# Patient Record
Sex: Female | Born: 1990 | Race: White | Hispanic: No | Marital: Married | Smoking: Never smoker
Health system: Southern US, Community
[De-identification: ages and names within clinical notes are randomized; demographics above are authoritative.]

## PROBLEM LIST (undated history)

## (undated) DIAGNOSIS — E079 Disorder of thyroid, unspecified: Secondary | ICD-10-CM

## (undated) HISTORY — DX: Disorder of thyroid, unspecified: E07.9

## (undated) HISTORY — PX: WISDOM TOOTH EXTRACTION: SHX21

---

## 2013-01-25 ENCOUNTER — Encounter: Payer: Self-pay | Admitting: *Deleted

## 2013-01-25 ENCOUNTER — Emergency Department (INDEPENDENT_AMBULATORY_CARE_PROVIDER_SITE_OTHER)
Admission: EM | Admit: 2013-01-25 | Discharge: 2013-01-25 | Disposition: A | Discharge status: Home / Self Care | Payer: Managed Care, Other (non HMO) | Source: Home / Self Care

## 2013-01-25 DIAGNOSIS — L02419 Cutaneous abscess of limb, unspecified: Secondary | ICD-10-CM

## 2013-01-25 DIAGNOSIS — L03116 Cellulitis of left lower limb: Secondary | ICD-10-CM

## 2013-01-25 MED ORDER — SULFAMETHOXAZOLE-TMP DS 800-160 MG PO TABS: 1.0000 | ORAL_TABLET | Freq: Two times a day (BID) | ORAL | Status: DC

## 2013-01-25 NOTE — ED Provider Notes (Signed)
CSN: [id number]     Arrival date & time 01/25/13  1420 History   None    Chief Complaint  Patient presents with  . Insect Bite   (Consider location/radiation/quality/duration/timing/severity/associated sxs/prior Treatment) HPI This patient complains of a RASH  Location: R leg  Onset: a few days   Course: worsening Self-treated with: Neosporin             Improvement with treatment: None  History Itching: no  Tenderness: yes  New medications/antibiotics: no  Pet exposure: no  Recent travel or tropical exposure: no  New soaps, shampoos, detergent, clothing: no  Tick/insect exposure: yes, maybe.  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   Red Flags Feeling ill: no  Fever: no  Facial/tongue swelling/difficulty breathing:  no  Diabetic or immunocompromised: no     History reviewed. No pertinent past medical history. Past Surgical History  Procedure Laterality Date  . Wisdom tooth extraction     History reviewed. No pertinent family history. History  Substance Use Topics  . Smoking status: Never Smoker   . Smokeless tobacco: Not on file  . Alcohol Use: No   OB History   Grav Para Term Preterm Abortions TAB SAB Ect Mult Living                 Review of Systems  All other systems reviewed and are negative.    Allergies  Other  Home Medications   Current Outpatient Rx  Name  Route  Sig  Dispense  Refill  . Norgestimate-Ethinyl Estradiol Triphasic (ORTHO TRI-CYCLEN, 28,) 0.18/0.215/0.25 MG-35 MCG tablet   Oral   Take 1 tablet by mouth daily.         Marland Kitchen sulfamethoxazole-trimethoprim (BACTRIM DS) 800-160 MG per tablet   Oral   Take 1 tablet by mouth 2 (two) times daily.   14 tablet   0    BP 112/70  Pulse 82  Temp(Src) 98.2 F (36.8 C) (Oral)  Resp 16  Ht 5\' 5"  (1.651 m)  Wt 199 lb (90.266 kg)  BMI 33.12 kg/m2  SpO2 97% Physical Exam  Nursing note and vitals reviewed. Constitutional: She is oriented to person, place, and time. She appears well-developed  and well-nourished.  HENT:  Head: Normocephalic and atraumatic.  Eyes: No scleral icterus.  Neck: Neck supple.  Cardiovascular: Regular rhythm and normal heart sounds.   Pulmonary/Chest: Effort normal and breath sounds normal. No respiratory distress.  Neurological: She is alert and oriented to person, place, and time.  Skin: Skin is warm and dry.     She has a small pustule which appears to be a bite of some kind with surrounding erythema approximately 5 cm in diameter.  No induration or fluctuance.  Minimal tenderness.  No streaking.  No other bites or lesions seen  Psychiatric: She has a normal mood and affect. Her speech is normal.    ED Course  Procedures (including critical care time) Labs Review Labs Reviewed - No data to display Imaging Review No results found.  MDM   1. Cellulitis of leg, left    This patient has a mild cellulitis of her left lower leg.  I gave her prescription for Bactrim for the next week.  No abscess is present.  I marked out the perimeter and she's going to keep an eye on it.  Wound precautions were given.  As long as she is getting better slowly no need to followup.  Keep covered and clean and dry.   Marlaine Hind,  MD 01/25/13 1457

## 2013-01-25 NOTE — ED Notes (Signed)
Patient c/o bug bite on left shin, redness, swelling, and pain. Has only tried OTC Neosporin.

## 2013-05-16 ENCOUNTER — Encounter: Payer: Self-pay | Admitting: Emergency Medicine

## 2013-05-16 ENCOUNTER — Emergency Department
Admission: EM | Admit: 2013-05-16 | Discharge: 2013-05-16 | Disposition: A | Discharge status: Home / Self Care | Payer: Managed Care, Other (non HMO) | Source: Home / Self Care

## 2013-05-16 DIAGNOSIS — Z111 Encounter for screening for respiratory tuberculosis: Secondary | ICD-10-CM

## 2013-05-16 MED ORDER — TUBERCULIN PPD 5 UNIT/0.1ML ID SOLN
5.0000 [IU] | Freq: Once | INTRADERMAL | Status: DC
  Administered 2013-05-16: 5 [IU] via INTRADERMAL

## 2013-05-16 NOTE — ED Notes (Signed)
Patient here for ppd test placement.

## 2013-05-18 ENCOUNTER — Encounter: Payer: Self-pay | Admitting: Emergency Medicine

## 2013-05-18 ENCOUNTER — Emergency Department
Admission: EM | Admit: 2013-05-18 | Discharge: 2013-05-18 | Disposition: A | Discharge status: Home / Self Care | Payer: Self-pay | Source: Home / Self Care

## 2013-05-18 NOTE — ED Notes (Signed)
PPD read of LFA placed on 05/16/13 @ 11am.  Result: NEGATIVE

## 2016-05-07 HISTORY — PX: CHOLECYSTECTOMY: SHX55

## 2020-09-21 ENCOUNTER — Other Ambulatory Visit: Payer: Self-pay

## 2020-09-21 ENCOUNTER — Encounter: Payer: Self-pay | Admitting: Obstetrics and Gynecology

## 2020-09-21 ENCOUNTER — Ambulatory Visit (INDEPENDENT_AMBULATORY_CARE_PROVIDER_SITE_OTHER): Payer: BC Managed Care – PPO | Admitting: Obstetrics and Gynecology

## 2020-09-21 VITALS — BP 118/62 | Ht 63.0 in | Wt 242.0 lb

## 2020-09-21 DIAGNOSIS — Z3169 Encounter for other general counseling and advice on procreation: Secondary | ICD-10-CM

## 2020-09-21 NOTE — Progress Notes (Signed)
Obstetrics & Gynecology Office Visit   Chief Complaint: Preconception Consult  Chief Complaint  Patient presents with  . Gynecologic Exam    Est care with Ob/Gyn. On OCP and will finish at the end of the month does not want to start any new. RM 4    History of Present Illness: Patient is a 30 y.o. No obstetric history on file. interested in pursuing pregnancy in the near future.  She reports regular menstrual periods, and is currently using OCP (estrogen/progesterone) for contraception.  Prior pregnancies history of not applicable.  Her past medical history is notable for obesity.  The patient is current on her vaccinations.    Family history for the patient and her partner's family were reviewed.  There is no family history of genetic diseases , specifically Alfonzo Feller disease, spinal muscular atrophy, muscular dystrophy, skeletal dysplasias, cystic fibrosis and sickle cell disease.  There is no family history of birth defects , specifically spina bifida, cardiac defects, omphalocele or gastroschisis and cleft lip or cleft palate.  There is no [place] Ashkenazi jewish, middle [place] and [place] ancestry.  There is no family history of mental retardation, fragile X, autism and or premature ovarian failure.    Pregnancies with current partner no Significant other with no medical problems or on any current medications No environmental or work place exposures.    Normal thyroid panel 2020, normal cholesterol 06/07/2020.  Normal uterus and ovaries on CT A/P 01/27/2017  Review of Systems: 10 point review of systems negative unless otherwise noted in HPI  Past Medical History:  There are no problems to display for this patient.   Past Surgical History:  Past Surgical History:  Procedure Laterality Date  . WISDOM TOOTH EXTRACTION      Obstetric History:  Never pregnant  Family History:  No family history on file.   Social History:  Social History   Socioeconomic  History  . Marital status: Single    Spouse name: Not on file  . Number of children: Not on file  . Years of education: Not on file  . Highest education level: Not on file  Occupational History  . Not on file  Tobacco Use  . Smoking status: Never Smoker  . Smokeless tobacco: Never Used  Vaping Use  . Vaping Use: Never used  Substance and Sexual Activity  . Alcohol use: No  . Drug use: No  . Sexual activity: Yes    Partners: Male    Birth control/protection: Pill  Other Topics Concern  . Not on file  Social History Narrative  . Not on file   Social Determinants of Health   Financial Resource Strain: Not on file  Food Insecurity: Not on file  Transportation Needs: Not on file  Physical Activity: Not on file  Stress: Not on file  Social Connections: Not on file  Intimate Partner Violence: Not on file    Allergies:  Allergies  Allergen Reactions  . Other     Allergic to cats    Medications: Prior to Admission medications   Medication Sig Start Date End Date Taking? Authorizing Provider  cetirizine (ZYRTEC) 10 MG tablet Take by mouth.    [provider]  Norgestimate-Ethinyl Estradiol Triphasic (ORTHO TRI-CYCLEN, 28,) 0.18/0.215/0.25 MG-35 MCG tablet Take 1 tablet by mouth daily.    [provider]    Physical Exam Vitals: Blood pressure 118/62, height 5\' 3"  (1.6 m), weight 242 lb (109.8 kg), last menstrual period 08/17/2020.  General:  NAD, well nourished appears stated age  HEENT: normocephalic, anicteric Pulmonary: No increased work of breathing Extremities: no edema, erythema, or tenderness Neurologic: Grossly intact Psychiatric: mood appropriate, affect full  Immunization History  Administered Date(s) Administered  . PPD Test 05/16/2013    Assessment: 30 y.o. No obstetric history on file. presenting for initial infertility evaluatoin  Problem List Items Addressed This Visit   None   Visit Diagnoses    Encounter for preconception  consultation    -  Primary     Plan: 1) We discussed that with discontinuation of OCP generally normal return to menstrual status is achieved within the first few months.  If menses are regular monthly chances of conception are 20% per month.  If menstrual cycles are irregular in interval this may require additional work up and interventions.  We discussed use of OPK for timing of intercourse.  She ask about PCOS in particular, no hirsutism or ACNE, weight stable.  If menstrual irregularities off OCPs this would be something evaluated in the work up.  2) The patient was instructed to start prenatal vitamins at least one month prior to actively trying to conceive.  The role and rational of prenatal vitamins in preventing neural tube defects were discussed.  3) Immunizations are up to date -ACOG recommendation of COVID vaccination for pregnant patient  4) Family history reviewed.  Preconception genetic testing and or counseling was offered at today's visit based on review of personal and family history. - discussed inheritest   5) Health care maintenance - pap up to date 05/09/2018 NILM  6) A total of 20 minutes were spent in face-to-face contact with the patient during this encounter with over half of that time devoted to counseling and coordination of care.  7) Return in about 1 year (around 09/21/2021), or if symptoms worsen or fail to improve, for annual.   Vena Austria, MD, [facility], [facility] 09/21/2020, 4:18 PM

## 2021-04-06 ENCOUNTER — Encounter: Payer: Self-pay | Admitting: Obstetrics and Gynecology

## 2021-04-26 ENCOUNTER — Other Ambulatory Visit: Payer: Self-pay

## 2021-04-26 ENCOUNTER — Ambulatory Visit: Payer: BC Managed Care – PPO | Admitting: Obstetrics and Gynecology

## 2021-04-26 ENCOUNTER — Encounter: Payer: Self-pay | Admitting: Obstetrics and Gynecology

## 2021-04-26 ENCOUNTER — Other Ambulatory Visit (HOSPITAL_COMMUNITY)
Admission: RE | Admit: 2021-04-26 | Discharge: 2021-04-26 | Disposition: A | Discharge status: Home / Self Care | Payer: Managed Care, Other (non HMO) | Source: Ambulatory Visit | Attending: Obstetrics and Gynecology | Admitting: Obstetrics and Gynecology

## 2021-04-26 VITALS — BP 114/73 | Ht 65.0 in | Wt 240.0 lb

## 2021-04-26 DIAGNOSIS — N939 Abnormal uterine and vaginal bleeding, unspecified: Secondary | ICD-10-CM | POA: Diagnosis not present

## 2021-04-26 DIAGNOSIS — E669 Obesity, unspecified: Secondary | ICD-10-CM

## 2021-04-26 DIAGNOSIS — Z1329 Encounter for screening for other suspected endocrine disorder: Secondary | ICD-10-CM | POA: Diagnosis not present

## 2021-04-26 DIAGNOSIS — Z6839 Body mass index (BMI) 39.0-39.9, adult: Secondary | ICD-10-CM

## 2021-04-26 DIAGNOSIS — Z124 Encounter for screening for malignant neoplasm of cervix: Secondary | ICD-10-CM

## 2021-04-26 DIAGNOSIS — Z131 Encounter for screening for diabetes mellitus: Secondary | ICD-10-CM

## 2021-04-26 NOTE — Progress Notes (Signed)
Gynecology Abnormal Uterine Bleeding Initial Evaluation   Chief Complaint:  Chief Complaint  Patient presents with   Menstrual Problem    Discuss fertility - Procedure RM     History of Present Illness:    Paitient is a 30 y.o. female with last menstrual period was 04/09/2021., presents today for a problem visit.  She complains of irregular menstrual cycles  since discontinuing OCP in May of this year. Intervals have lengthened, not consistently proceeded by moliminal symptoms.    PCOS: weight stable, some acne, fascial hirsutism Thyroid: no temperature intolerance, diarrhea/constipation, palpitation, hair loss or skin changes Hyperprolactinemia: no headaches, vision changes, or nipple discharge Fragile-X: no family history of premature ovarian failure, autism  Tubal Factor: no history of pelvic surgeries, PID, GC/CT   Review of Systems: Review of Systems  Constitutional: Negative.   Eyes:  Negative for blurred vision and double vision.  Gastrointestinal: Negative.   Genitourinary: Negative.   Neurological:  Negative for headaches.   Past Medical History:  There are no problems to display for this patient.   Past Surgical History:  Past Surgical History:  Procedure Laterality Date   WISDOM TOOTH EXTRACTION      Obstetric History: G0P0000  Family History:  Family History  Problem Relation Age of Onset   Diabetes Paternal Grandmother     Social History:  Social History   Socioeconomic History   Marital status: Single    Spouse name: Not on file   Number of children: Not on file   Years of education: Not on file   Highest education level: Not on file  Occupational History   Not on file  Tobacco Use   Smoking status: Never   Smokeless tobacco: Never  Vaping Use   Vaping Use: Never used  Substance and Sexual Activity   Alcohol use: No   Drug use: No   Sexual activity: Yes    Partners: Male    Birth control/protection: None  Other Topics Concern    Not on file  Social History Narrative   Not on file   Social Determinants of Health   Financial Resource Strain: Not on file  Food Insecurity: Not on file  Transportation Needs: Not on file  Physical Activity: Not on file  Stress: Not on file  Social Connections: Not on file  Intimate Partner Violence: Not on file    Allergies:  Allergies  Allergen Reactions   Other     Allergic to cats    Medications: Prior to Admission medications   Medication Sig Start Date End Date Taking? Authorizing Provider  cetirizine (ZYRTEC) 10 MG tablet Take by mouth.   Yes [provider]  loratadine (CLARITIN) 10 MG tablet Take by mouth.   Yes [provider]  Norgestimate-Ethinyl Estradiol Triphasic 0.18/0.215/0.25 MG-35 MCG tablet Take 1 tablet by mouth daily. Patient not taking: Reported on 04/26/2021    [provider]    Physical Exam Blood pressure 114/73, height 5\' 5"  (1.651 m), weight 240 lb (108.9 kg), last menstrual period 04/09/2021. Body mass index is 39.94 kg/m.  Patient's last menstrual period was 04/09/2021.  General: NAD HEENT: normocephalic, anicteric Pulmonary: No increased work of breathing Genitourinary:  External: Normal external female genitalia.  Normal urethral meatus, normal Bartholin's and Skene's glands.    Vagina: Normal vaginal mucosa, no evidence of prolapse.    Cervix: Grossly normal in appearance, no bleeding  Uterus: Non-enlarged, mobile, normal contour.  No CMT  Adnexa: ovaries non-enlarged, no  adnexal masses  Rectal: deferred  Lymphatic: no evidence of inguinal lymphadenopathy Extremities: no edema, erythema, or tenderness Neurologic: Grossly intact Psychiatric: mood appropriate, affect full  Female chaperone present for pelvic portions of the physical exam  Assessment: 30 y.o. G0P0000 with abnormal uterine bleeding  Plan: Problem List Items Addressed This Visit   None Visit Diagnoses     Screening for malignant  neoplasm of cervix    -  Primary   Relevant Orders   Cytology - PAP (Completed)   Abnormal uterine bleeding       Relevant Orders   17-Hydroxyprogesterone (Completed)   Androstenedione (Completed)   DHEA-sulfate (Completed)   FSH/LH (Completed)   Hemoglobin A1c (Completed)   Testosterone,Free and Total (Completed)   Prolactin (Completed)   TSH (Completed)   US PELVIC COMPLETE WITH TRANSVAGINAL   Thyroid disorder screening       Relevant Orders   TSH (Completed)   Screening for diabetes mellitus       Relevant Orders   Hemoglobin A1c (Completed)   Class 2 obesity with body mass index (BMI) of 39.0 to 39.9 in adult, unspecified obesity type, unspecified whether serious comorbidity present       Relevant Orders   Hemoglobin A1c (Completed)       1) Discussed management options for abnormal uterine bleeding including expectant, NSAIDs, tranexamic acid (Lysteda), oral progesterone (Provera, norethindrone, megace), Depo Provera, Levonorgestrel containing IUD, endometrial ablation (Novasure) or hysterectomy as definitive surgical management.  As the patient is interested in conceiving none of these options are indicated.   Final management decision will hinge on results of patient's work up and whether an underlying etiology for the patients bleeding symptoms can be discerned.  We will conduct a basic work up examining using the PALM-COIEN classification system.  High suspicion for PCOS.  We discussed the underlying etiologies which may be implicated in a couple experiencing difficulty conceiving.  The average couple will conceive within the span of 1 year with unprotected coitus, with a monthly fecundity rate of 20% or 1 in 5.  Even without further work up or intervention the patient and her partner may be successful in conceiving unassisted, although if an underlying etiology can be identified and addressed fecundity rate may improve.  The work up entails examining for ovulatory function,  tubal patency, and ruling out female factor infertility.  These may be looked at concurrently or sequentially.  The downside of sequential work up is that this method may miss issues if more than one compartment is contributing.  She is aware that tubal factor or moderate to severe female factor infertility will require further consultation with a reproductive endocrinologist.  In the case of anovulation, use of Clomid (clomiphen citrate) or Femara (letrazole) were discussed with the understanding the the later is an off-label, but well supported use.  Current recommendation are to use letrozole first line secondary to increased live birth rate compared to clomid for patients with PCOS ("Polycystic Ovarian Syndrome" ACOG Practice Bulletin 194 June 2018).  With either of these drugs the risk of multiples increases from the standard population rate of 2% to approximately 10%, with higher order multiples possible but unlikely.  Both drugs may require some time to titrate to the appropriate dosage to ensure consistent ovulation.  Cycles will be limited to 6 cycles on each drug secondary to decreasing rates of conception after 6 cycles.  In addition should patient be started on ovulation induction with Clomid she was advised to discontinue the  drug for any vision changes as this is a rare but potentially permanent side-effect if medication is continued.  Was counseled that letrozole may cause idiopathic bone pain that generally resolves.  Hot flashes, headaches, and nausea were mentioned as the most commonly encountered side-effects of both drugs.  We discussed timing of intercourse as well as the use of ovulation predictor kits identify the patient's fertile window each month.    - last pap 05/09/2018 NILM repeat today - routine labs - ultrasound ordered - interested in conceiving off OCP since May with irregular cycles since, occasional moliminal symptoms   2) A total of 32 minutes were spent in face-to-face contact  with the patient during this encounter with over half of that time devoted to counseling and coordination of care.   3) Return after [facility] ultrasound results to discuss next steps.   Vena Austria, MD, [facility], [facility] 04/26/2021, 9:19 AM

## 2021-04-28 ENCOUNTER — Encounter: Payer: Self-pay | Admitting: Obstetrics and Gynecology

## 2021-05-01 LAB — 17-HYDROXYPROGESTERONE: 17-Hydroxyprogesterone: 63 ng/dL

## 2021-05-01 LAB — HEMOGLOBIN A1C
Est. average glucose Bld gHb Est-mCnc: 105 mg/dL
Hgb A1c MFr Bld: 5.3 % (ref 4.8–5.6)

## 2021-05-01 LAB — ANDROSTENEDIONE: Androstenedione: 103 ng/dL (ref 41–262)

## 2021-05-01 LAB — DHEA-SULFATE: DHEA-SO4: 374 ug/dL (ref 84.8–378.0)

## 2021-05-01 LAB — FSH/LH
FSH: 3.9 m[IU]/mL
LH: 12.2 m[IU]/mL

## 2021-05-01 LAB — TSH: TSH: 4.8 u[IU]/mL — ABNORMAL HIGH (ref 0.450–4.500)

## 2021-05-01 LAB — TESTOSTERONE,FREE AND TOTAL
Testosterone, Free: 2.8 pg/mL (ref 0.0–4.2)
Testosterone: 42 ng/dL (ref 13–71)

## 2021-05-01 LAB — PROLACTIN: Prolactin: 14.6 ng/mL (ref 4.8–23.3)

## 2021-05-03 LAB — CYTOLOGY - PAP
Adequacy: ABSENT
Comment: NEGATIVE
Diagnosis: NEGATIVE
High risk HPV: NEGATIVE

## 2021-05-08 ENCOUNTER — Encounter: Payer: Self-pay | Admitting: Obstetrics and Gynecology

## 2021-05-10 ENCOUNTER — Ambulatory Visit
Admission: RE | Admit: 2021-05-10 | Discharge: 2021-05-10 | Disposition: A | Discharge status: Home / Self Care | Payer: BC Managed Care – PPO | Source: Ambulatory Visit | Attending: Obstetrics and Gynecology | Admitting: Obstetrics and Gynecology

## 2021-05-10 ENCOUNTER — Other Ambulatory Visit: Payer: Self-pay

## 2021-05-10 DIAGNOSIS — N939 Abnormal uterine and vaginal bleeding, unspecified: Secondary | ICD-10-CM | POA: Diagnosis present

## 2021-05-11 ENCOUNTER — Encounter: Payer: Self-pay | Admitting: Obstetrics and Gynecology

## 2021-05-11 ENCOUNTER — Other Ambulatory Visit: Payer: Self-pay | Admitting: Obstetrics and Gynecology

## 2021-05-11 DIAGNOSIS — N939 Abnormal uterine and vaginal bleeding, unspecified: Secondary | ICD-10-CM

## 2021-05-11 DIAGNOSIS — R7989 Other specified abnormal findings of blood chemistry: Secondary | ICD-10-CM

## 2021-05-11 NOTE — Telephone Encounter (Signed)
Contacted patient via phone. Patient is scheduled for 05/17/21

## 2021-05-17 ENCOUNTER — Other Ambulatory Visit: Payer: BC Managed Care – PPO

## 2021-05-17 ENCOUNTER — Other Ambulatory Visit: Payer: Self-pay

## 2021-05-17 DIAGNOSIS — N939 Abnormal uterine and vaginal bleeding, unspecified: Secondary | ICD-10-CM

## 2021-05-17 DIAGNOSIS — R7989 Other specified abnormal findings of blood chemistry: Secondary | ICD-10-CM

## 2021-05-18 LAB — THYROID PANEL WITH TSH
Free Thyroxine Index: 1.7 (ref 1.2–4.9)
T3 Uptake Ratio: 23 % — ABNORMAL LOW (ref 24–39)
T4, Total: 7.6 ug/dL (ref 4.5–12.0)
TSH: 5.44 u[IU]/mL — ABNORMAL HIGH (ref 0.450–4.500)

## 2021-05-23 ENCOUNTER — Other Ambulatory Visit: Payer: Self-pay | Admitting: Obstetrics and Gynecology

## 2021-05-23 ENCOUNTER — Encounter: Payer: Self-pay | Admitting: Obstetrics and Gynecology

## 2021-05-23 DIAGNOSIS — E038 Other specified hypothyroidism: Secondary | ICD-10-CM

## 2021-05-23 DIAGNOSIS — E039 Hypothyroidism, unspecified: Secondary | ICD-10-CM | POA: Insufficient documentation

## 2021-05-23 MED ORDER — LEVOTHYROXINE SODIUM 25 MCG PO TABS: 50.0000 ug | ORAL_TABLET | Freq: Every day | ORAL | 3 refills | Status: DC

## 2021-05-23 NOTE — Progress Notes (Signed)
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  Will start low dose synthroid 4mcg, need recheck of levels in about 6-12 weeks

## 2021-05-23 NOTE — Telephone Encounter (Signed)
Please schedule in 6-8 weeks with sch or rph

## 2021-06-05 ENCOUNTER — Encounter: Payer: Self-pay | Admitting: Obstetrics & Gynecology

## 2021-07-07 ENCOUNTER — Telehealth: Payer: Self-pay

## 2021-07-07 NOTE — Telephone Encounter (Signed)
This encounter was created in error - please disregard.

## 2021-07-07 NOTE — Telephone Encounter (Signed)
Pt called after hour nurse 07/06/21 4;:49pm stating she when to p/u her thyroid med and the pharm didn't have refill for it; they told her they sent a request for a refill and maybe the rx is expired.  [phone]  I called pt this morning to ask what pharm and if she had enough to get her thru to her appt Wed.  She prefers [facility] and she took her last pill this morning. Adv I will send to On Call. ?

## 2021-07-10 ENCOUNTER — Other Ambulatory Visit: Payer: Self-pay | Admitting: Advanced Practice Midwife

## 2021-07-10 DIAGNOSIS — E038 Other specified hypothyroidism: Secondary | ICD-10-CM

## 2021-07-10 MED ORDER — LEVOTHYROXINE SODIUM 25 MCG PO TABS: 50.0000 ug | ORAL_TABLET | Freq: Every day | ORAL | 0 refills | Status: DC

## 2021-07-10 NOTE — Telephone Encounter (Signed)
Pt aware refill sent to [facility]. ?

## 2021-07-10 NOTE — Progress Notes (Signed)
Rx for synthroid sent to [facility] per pt request- enough to last until next visit.  ?

## 2021-07-12 ENCOUNTER — Other Ambulatory Visit: Payer: Self-pay

## 2021-07-12 ENCOUNTER — Encounter: Payer: Self-pay | Admitting: Obstetrics & Gynecology

## 2021-07-12 ENCOUNTER — Ambulatory Visit: Payer: BC Managed Care – PPO | Admitting: Obstetrics & Gynecology

## 2021-07-12 VITALS — BP 120/80 | Ht 65.0 in | Wt 241.0 lb

## 2021-07-12 DIAGNOSIS — E038 Other specified hypothyroidism: Secondary | ICD-10-CM | POA: Diagnosis not present

## 2021-07-12 MED ORDER — LEVOTHYROXINE SODIUM 50 MCG PO TABS: 50.0000 ug | ORAL_TABLET | Freq: Every day | ORAL | 3 refills | Status: DC

## 2021-07-12 NOTE — Progress Notes (Signed)
?  History of Present Illness:  Kristie Santiago is a 31 y.o. who recently underwent labwork for infertility.  She presents for discussion of results and plan of management. Results revealed low tgyroid, and she was thus started on medicine (Jan).  Since that time she does report more regularity w her periods.  No neg side effects.  She dis miss pills from Fr to [facility] due to Rx not having refills at pharmacy.  Planning for pregnancy, labs and [place] reviewed w pt again.   ? ?PMHx: ?She  has no past medical history on file. Also,  has a past surgical history that includes Wisdom tooth extraction., family history includes Diabetes in her paternal grandmother.,  reports that she has never smoked. She has never used smokeless tobacco. She reports that she does not drink alcohol and does not use drugs. ?Current Meds  ?Medication Sig  ? cetirizine (ZYRTEC) 10 MG tablet Take by mouth.  ? levothyroxine (SYNTHROID) 25 MCG tablet Take 2 tablets (50 mcg total) by mouth daily before breakfast.  ? loratadine (CLARITIN) 10 MG tablet Take by mouth.  ?. Also, is allergic to other.. ? ?Review of Systems  ?All other systems reviewed and are negative. ? ?Physical Exam:  ?BP 120/80   Ht 5\' 5"  (1.651 m)   Wt 241 lb (109.3 kg)   LMP 07/10/2021   BMI 40.10 kg/m?  Body mass index is 40.1 kg/m?09/09/2021 ?Constitutional: Well nourished, well developed female in no acute distress.  ?Abdomen: diffusely non tender to palpation, non distended, and no masses, hernias ?Neuro: Grossly intact ?Psych:  Normal mood and affect.   ? ?Assessment:  ?Problem List Items Addressed This Visit   ? ?  ? Endocrine  ? Subclinical hypothyroidism - Primary  ? Relevant Orders  ? Thyroid Panel With TSH  ? ?Recheck levels once back on again regularly. ?If normal, then give a few months to see as to fertility ?Options of ovulation inducing meds discussed for later intervention ?Synthroid 50 mcg daily current dose.  Up as needed.  Benefit of normal to high-normal level for  fertility and fetal effects discussed. ? ?A total of 22 minutes were spent face-to-face with the patient as well as preparation, review, communication, and documentation during this encounter.  ? ? ?Marland Kitchen, MD, FACOG ?[facility], [facility] ?07/12/2021  2:46 PM ? ?

## 2021-07-19 ENCOUNTER — Other Ambulatory Visit: Payer: Self-pay

## 2021-07-19 ENCOUNTER — Other Ambulatory Visit: Payer: BC Managed Care – PPO

## 2021-07-19 DIAGNOSIS — E038 Other specified hypothyroidism: Secondary | ICD-10-CM

## 2021-07-20 LAB — THYROID PANEL WITH TSH
Free Thyroxine Index: 3 (ref 1.2–4.9)
T3 Uptake Ratio: 28 % (ref 24–39)
T4, Total: 10.8 ug/dL (ref 4.5–12.0)
TSH: 0.385 u[IU]/mL — ABNORMAL LOW (ref 0.450–4.500)

## 2021-10-16 ENCOUNTER — Telehealth: Payer: Self-pay

## 2021-10-16 NOTE — Telephone Encounter (Signed)
Pt called triage wanting someone to order HCG labs for her, she had a positive pregnancy test, She is going to [facility] for her pregnancy so I advised her to have them put the lab orders in.

## 2022-01-06 IMAGING — US US PELVIS COMPLETE WITH TRANSVAGINAL
1 series · 13 of 25 positions shown · non-contrast
Comparison: None available.

CLINICAL DATA: Initial evaluation for abnormal uterine bleeding,
question PCOS.

EXAM:
TRANSABDOMINAL AND TRANSVAGINAL ULTRASOUND OF PELVIS
TECHNIQUE: Both transabdominal and transvaginal ultrasound examinations of the
pelvis were performed. Transabdominal technique was performed for
global imaging of the pelvis including uterus, ovaries, adnexal
regions, and pelvic cul-de-sac. It was necessary to proceed with
endovaginal exam following the transabdominal exam to visualize the
endometrium and ovaries.

[Series 1: us pelvic complete with transvaginal · 13 of 59 slices shown]
[im 1/59]
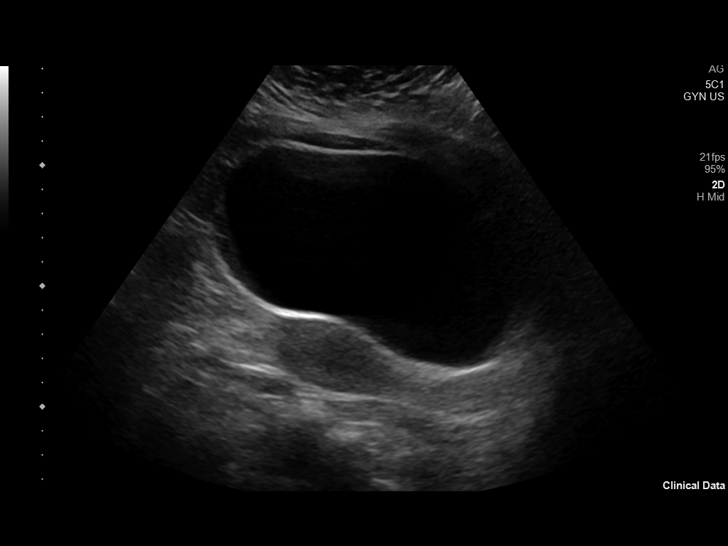
[im 5/59]
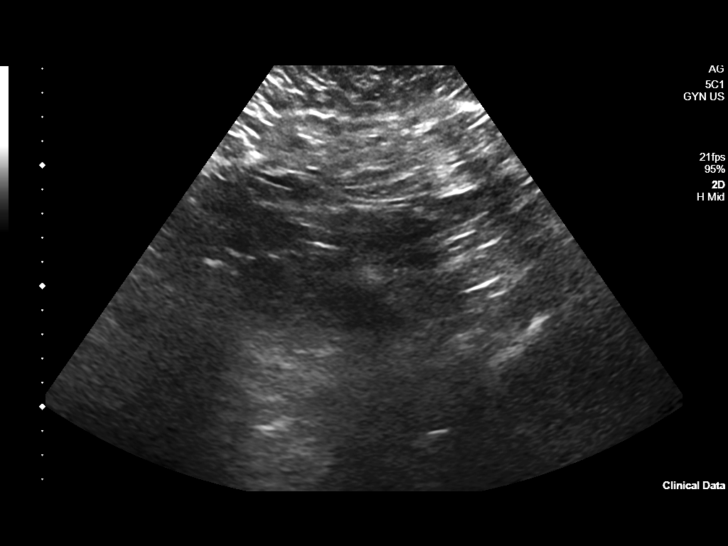
[im 10/59]
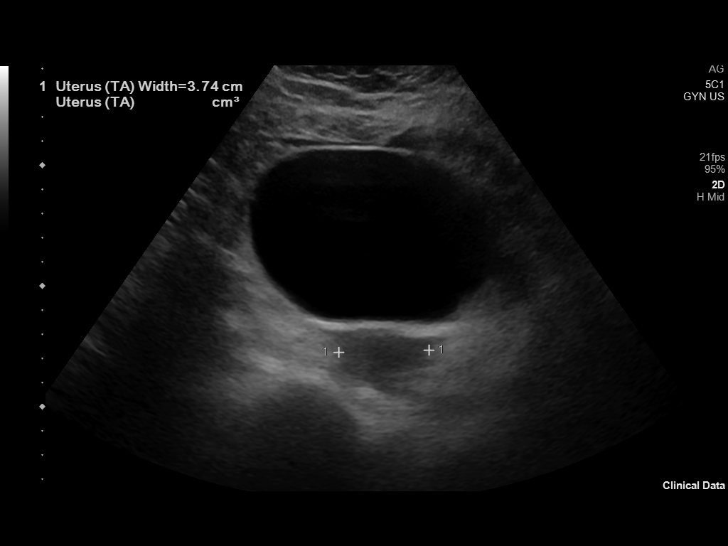
[im 15/59]
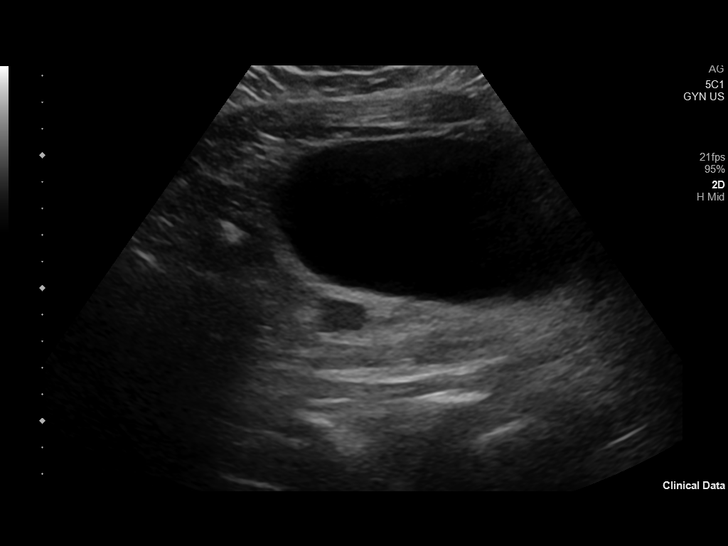
[im 20/59]
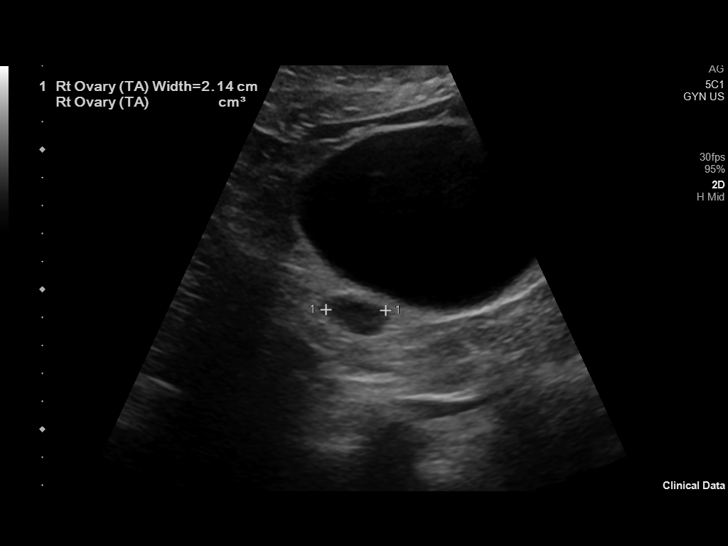
[im 25/59]
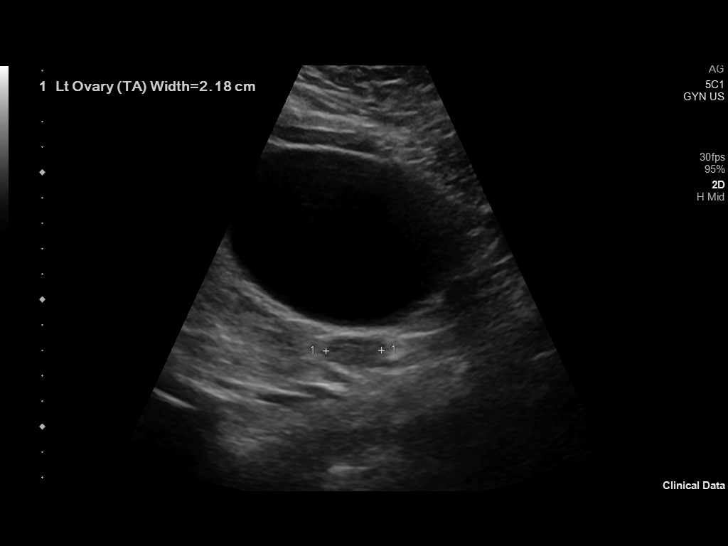
[im 30/59]
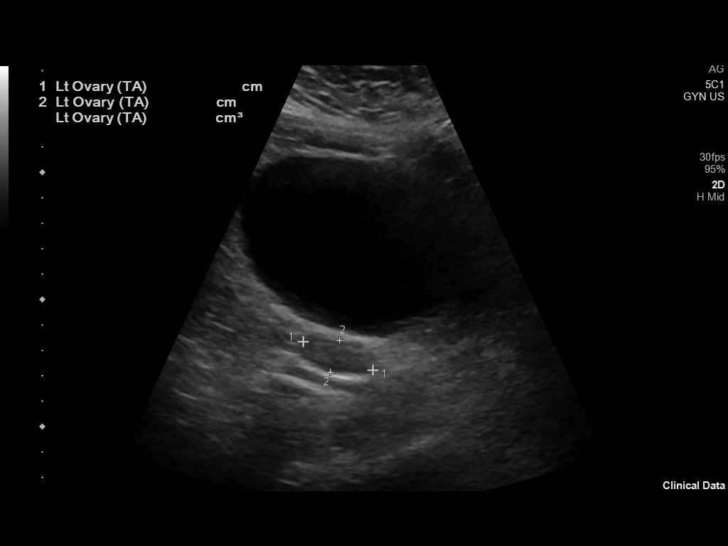
[im 34/59]
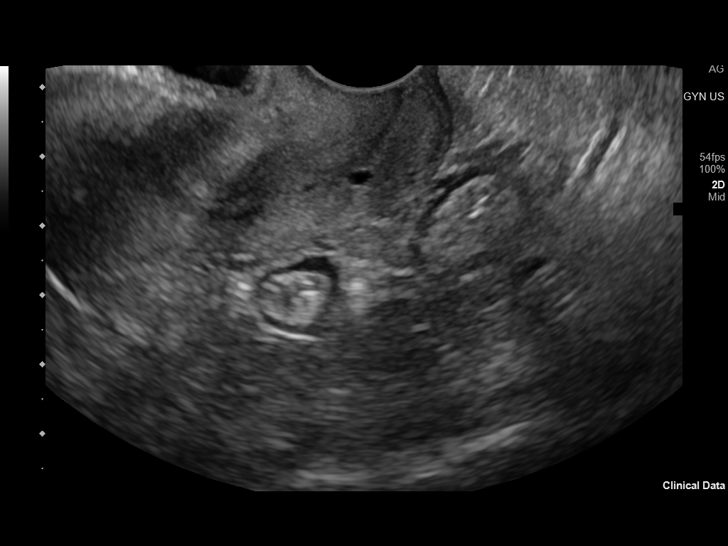
[im 39/59]
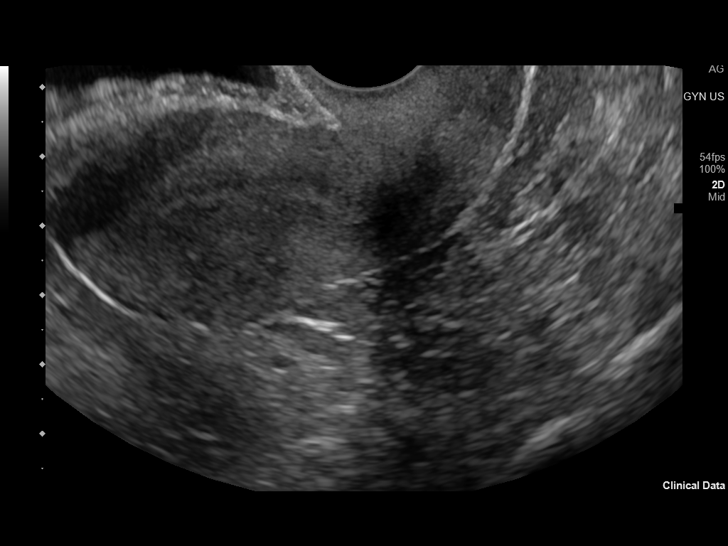
[im 44/59]
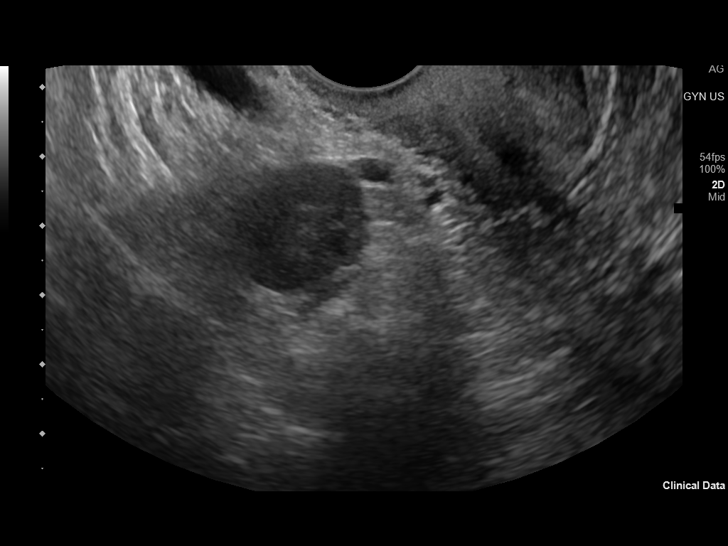
[im 49/59]
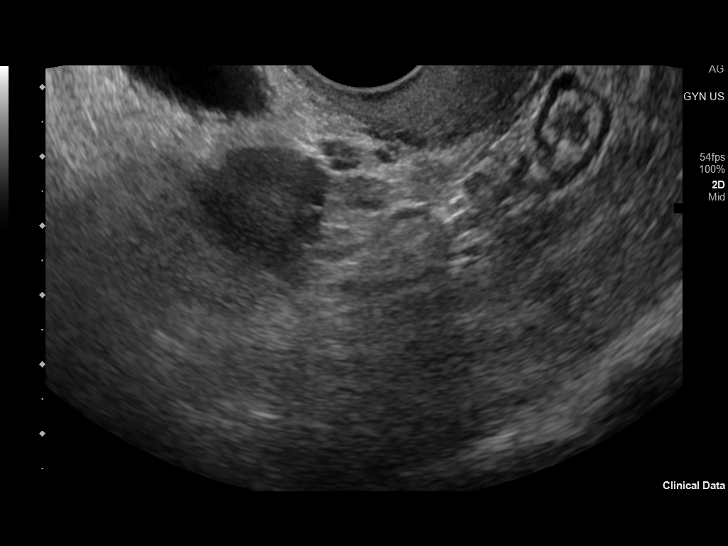
[im 54/59]
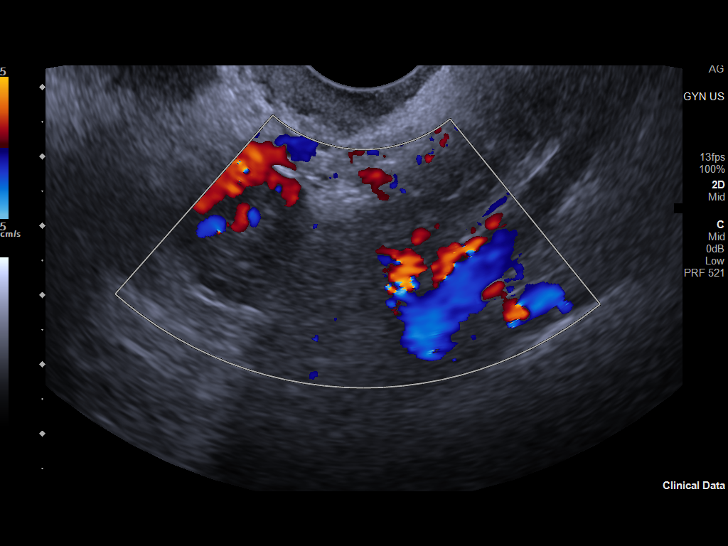
[im 59/59]
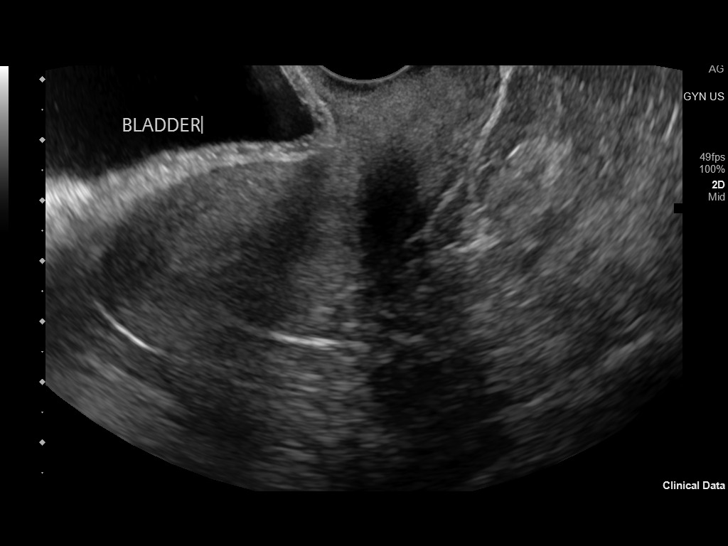

[13 of 25 positions shown; findings below may reference images not displayed]

FINDINGS: Uterus

Measurements: 9.0 x 2.9 x 3.7 cm = volume: 51.0 mL. Uterus is
anteverted. No discrete fibroid or other mass.

Endometrium

Thickness: 3 mm.  No focal abnormality visualized.

Right ovary

Measurements: 1.9 x 1.7 x 1.9 cm = volume: 3.0 mL. Normal
appearance/no adnexal mass. No sonographic features of PCOS.

Left ovary

Measurements: 2.0 x 1.5 x 1.4 cm = volume: 2.2 mL. Normal
appearance/no adnexal mass. No sonographic features of PCOS.

Other findings

No abnormal free fluid.
IMPRESSION: 1. Normal pelvic ultrasound. No sonographic features to suggest
PCOS.
2. Endometrial stripe within normal limits measuring 3 mm in
thickness. If bleeding remains unresponsive to hormonal or medical
therapy, sonohysterogram should be considered for focal lesion
work-up. (Ref: Radiological Reasoning: Algorithmic Workup of
Abnormal Vaginal Bleeding with Endovaginal Sonography and
Sonohysterography. AJR 7996; 191:S68-73).

## 2022-06-20 LAB — HM HIV SCREENING LAB: HM HIV Screening: NEGATIVE

## 2022-06-20 LAB — HM HEPATITIS C SCREENING LAB: HM Hepatitis Screen: NEGATIVE

## 2022-09-10 NOTE — Progress Notes (Unsigned)
    Aleen Sells D.[facility] [address] [place] [address] Phone: [phone]   Assessment and Plan:     There are no diagnoses linked to this encounter.  ***   Pertinent previous records reviewed include ***   Follow Up: ***     Subjective:   I, Judge Duque, am serving as a Neurosurgeon for Doctor Richardean Sale  Chief Complaint: bilat carpal tunnel   HPI:   09/11/2022 Patient is a 32 year old female complaining of bilat carpal tunnel pain. Patient states  Relevant Historical Information: ***  Additional pertinent review of systems negative.   Current Outpatient Medications:    cetirizine (ZYRTEC) 10 MG tablet, Take by mouth., Disp: , Rfl:    levothyroxine (SYNTHROID) 50 MCG tablet, Take 1 tablet (50 mcg total) by mouth daily before breakfast., Disp: 90 tablet, Rfl: 3   loratadine (CLARITIN) 10 MG tablet, Take by mouth., Disp: , Rfl:    Objective:     There were no vitals filed for this visit.    There is no height or weight on file to calculate BMI.    Physical Exam:    ***   Electronically signed by:  Aleen Sells D.[facility] 12:16 PM 09/10/22

## 2022-09-11 ENCOUNTER — Ambulatory Visit (INDEPENDENT_AMBULATORY_CARE_PROVIDER_SITE_OTHER): Payer: BC Managed Care – PPO | Admitting: Sports Medicine

## 2022-09-11 VITALS — BP 108/80 | HR 77 | Ht 65.0 in | Wt 231.0 lb

## 2022-09-11 DIAGNOSIS — M65331 Trigger finger, right middle finger: Secondary | ICD-10-CM

## 2022-09-11 DIAGNOSIS — M65332 Trigger finger, left middle finger: Secondary | ICD-10-CM

## 2022-09-11 NOTE — Patient Instructions (Signed)
Good to see you   

## 2022-10-02 NOTE — Progress Notes (Signed)
    Kristie Santiago D.[facility] [address] [place] [address] Phone: [phone]   Assessment and Plan:     1. Trigger middle finger of left hand 2. Trigger middle finger of right hand  -Chronic with exacerbation, subsequent visit - Resolution of triggering of bilateral third digit after trigger finger CSI performed at previous office visit on 09/11/2022.  Trigger fingers were likely flared during pregnancy - May continue to use topical Voltaren gel as needed  Pertinent previous records reviewed include none   Follow Up: As needed.  Hope to have at least 3 months relief from injections.  Could repeat injections if longer than 3 months relief is achieved.  Could refer to hand surgery if less than 3 months relief   Subjective:   I, Kristie Santiago, am serving as a Neurosurgeon for Doctor Richardean Sale   Chief Complaint: bilat carpal tunnel    HPI:    09/11/2022 Patient is a 32 year old female complaining of bilat carpal tunnel pain. Patient states that she has pain in her middle fingers in the morning they get stuck and she can't bend or carry her new born, been going on for about 6 months, no meds consistently for the pain, no numbness or tingling, no radiating pain, TTP when she presses on them , the more she goes through the day the easier the more she is able to do   10/09/2022 Patient states no issues since CSI    Relevant Historical Information: Recent C-section delivery,  Additional pertinent review of systems negative.   Current Outpatient Medications:    cetirizine (ZYRTEC) 10 MG tablet, Take by mouth., Disp: , Rfl:    levothyroxine (SYNTHROID) 50 MCG tablet, Take 1 tablet (50 mcg total) by mouth daily before breakfast., Disp: 90 tablet, Rfl: 3   loratadine (CLARITIN) 10 MG tablet, Take by mouth., Disp: , Rfl:    Objective:     Vitals:   10/09/22 1535  Weight: 238 lb (108 kg)  Height: 5\' 5"  (1.651 m)      Body mass index is  39.61 kg/m.    Physical Exam:    General: Appears well, nad, nontoxic and pleasant Neuro:sensation intact, strength is 5/5 with df/pf/inv/ev, muscle tone wnl Skin:no susupicious lesions or rashes   Bilateral hand/wrist: No deformity or swelling appreciated. ROM  Ext 90, flexion70, radial/ulnar deviation 30 No palpable nodule at base of third MCP bilaterally and no catching of 3rd digit nttp over the snuff box, dorsal carpals, volar carpals, radial styloid, ulnar styloid, 1st mcp, tfcc Negative Tinel's     Electronically signed by:  Kristie Santiago D.[facility] 3:38 PM 10/09/22

## 2022-10-09 ENCOUNTER — Ambulatory Visit (INDEPENDENT_AMBULATORY_CARE_PROVIDER_SITE_OTHER): Payer: BC Managed Care – PPO | Admitting: Sports Medicine

## 2022-10-09 VITALS — BP 122/78 | HR 103 | Ht 65.0 in | Wt 238.0 lb

## 2022-10-09 DIAGNOSIS — M65331 Trigger finger, right middle finger: Secondary | ICD-10-CM | POA: Diagnosis not present

## 2022-10-09 DIAGNOSIS — M65332 Trigger finger, left middle finger: Secondary | ICD-10-CM

## 2022-12-10 DIAGNOSIS — F4323 Adjustment disorder with mixed anxiety and depressed mood: Secondary | ICD-10-CM | POA: Diagnosis not present

## 2022-12-10 DIAGNOSIS — Z634 Disappearance and death of family member: Secondary | ICD-10-CM | POA: Diagnosis not present

## 2022-12-26 ENCOUNTER — Emergency Department: Payer: BC Managed Care – PPO

## 2022-12-26 ENCOUNTER — Emergency Department
Admission: EM | Admit: 2022-12-26 | Discharge: 2022-12-26 | Disposition: A | Discharge status: Home / Self Care | Payer: BC Managed Care – PPO | Attending: Emergency Medicine | Admitting: Emergency Medicine

## 2022-12-26 ENCOUNTER — Other Ambulatory Visit: Payer: Self-pay

## 2022-12-26 DIAGNOSIS — W1830XA Fall on same level, unspecified, initial encounter: Secondary | ICD-10-CM | POA: Insufficient documentation

## 2022-12-26 DIAGNOSIS — R202 Paresthesia of skin: Secondary | ICD-10-CM | POA: Diagnosis not present

## 2022-12-26 DIAGNOSIS — M25572 Pain in left ankle and joints of left foot: Secondary | ICD-10-CM | POA: Insufficient documentation

## 2022-12-26 DIAGNOSIS — Y990 Civilian activity done for income or pay: Secondary | ICD-10-CM | POA: Diagnosis not present

## 2022-12-26 DIAGNOSIS — W19XXXA Unspecified fall, initial encounter: Secondary | ICD-10-CM | POA: Diagnosis not present

## 2022-12-26 DIAGNOSIS — M79672 Pain in left foot: Secondary | ICD-10-CM | POA: Diagnosis not present

## 2022-12-26 DIAGNOSIS — S99912A Unspecified injury of left ankle, initial encounter: Secondary | ICD-10-CM | POA: Diagnosis not present

## 2022-12-26 DIAGNOSIS — M25579 Pain in unspecified ankle and joints of unspecified foot: Secondary | ICD-10-CM | POA: Diagnosis not present

## 2022-12-26 MED ORDER — HYDROCODONE-ACETAMINOPHEN 5-325 MG PO TABS: 1.0000 | ORAL_TABLET | Freq: Four times a day (QID) | ORAL | 0 refills | Status: DC | PRN

## 2022-12-26 MED ORDER — KETOROLAC TROMETHAMINE 30 MG/ML IJ SOLN
30.0000 mg | Freq: Once | INTRAMUSCULAR | Status: AC
  Administered 2022-12-26: 30 mg via INTRAMUSCULAR
  Filled 2022-12-26: qty 1

## 2022-12-26 NOTE — ED Provider Notes (Signed)
[facility] Provider Note    Event Date/Time   First MD Initiated Contact with Patient 12/26/22 1836     (approximate)   History   Ankle Pain   HPI  Kristie Santiago is a 32 y.o. female who presents with a left ankle injury     Physical Exam   Triage Vital Signs: ED Triage Vitals  Encounter Vitals Group     BP 12/26/22 1826 (!) 120/53     Systolic BP Percentile --      Diastolic BP Percentile --      Pulse Rate 12/26/22 1825 96     Resp 12/26/22 1825 18     Temp 12/26/22 1825 98 F (36.7 C)     Temp Source 12/26/22 1825 Oral     SpO2 12/26/22 1825 98 %     Weight --      Height --      Head Circumference --      Peak Flow --      Pain Score 12/26/22 1825 10     Pain Loc --      Pain Education --      Exclude from Growth Chart --     Most recent vital signs: Vitals:   12/26/22 1825 12/26/22 1826  BP:  (!) 120/53  Pulse: 96   Resp: 18   Temp: 98 F (36.7 C)   SpO2: 98%      General: Awake, no distress.  CV:  Good peripheral perfusion.  Resp:  Normal effort.  Abd:  No distention.  Other:  Left lower extremity, swelling along the lateral malleolus, no bony normalities palpated, warm well-perfused   ED Results / Procedures / Treatments   Labs (all labs ordered are listed, but only abnormal results are displayed) Labs Reviewed - No data to display   EKG     RADIOLOGY  Ankle x-ray viewed interpret by me, no fracture   PROCEDURES:  Critical Care performed:   .Ortho Injury Treatment  Date/Time: 12/26/2022 9:36 PM  Performed by: Jene Every, MD Authorized by: Jene Every, MD   Consent:    Consent obtained:  Verbal   Consent given by:  PatientInjury location: ankle Location details: left ankle Injury type: soft tissue Pre-procedure neurovascular assessment: neurovascularly intact Pre-procedure distal perfusion: normal Pre-procedure neurological function: normal Pre-procedure range of motion:  normal  Patient sedated: NoImmobilization: splint Splint type: short leg Splint Applied by: ED Provider Supplies used: Ortho-Glass Post-procedure neurovascular assessment: post-procedure neurovascularly intact Post-procedure distal perfusion: normal Post-procedure neurological function: normal Post-procedure range of motion: normal      MEDICATIONS ORDERED IN ED: Medications  ketorolac (TORADOL) 30 MG/ML injection 30 mg (30 mg Intramuscular Given 12/26/22 2002)     IMPRESSION / MDM / ASSESSMENT AND PLAN / ED COURSE  I reviewed the triage vital signs and the nursing notes. Patient's presentation is most consistent with acute complicated illness / injury requiring diagnostic workup.  Patient with injury to the ankle, differential includes fracture versus sprain  X-ray is reassuring, splint applied by me, treated with IM Toradol, crutches provided, outpatient follow-up with Ortho as needed        FINAL CLINICAL IMPRESSION(S) / ED DIAGNOSES   Final diagnoses:  Acute left ankle pain     Rx / DC Orders   ED Discharge Orders          Ordered    HYDROcodone-acetaminophen (NORCO/VICODIN) 5-325 MG tablet  Every 6 hours PRN  12/26/22 1946             Note:  This document was prepared using Dragon voice recognition software and may include unintentional dictation errors.   Jene Every, MD 12/26/22 2137

## 2022-12-26 NOTE — ED Triage Notes (Signed)
Pt to ED via [facility] from work. Pt fell off curb while leaving work. Pt has pain and swelling to left foot. Pt also reports mild tingling in foot. DP pulse palpable.   EMS VS:  112/72 92 HR RR 18 96% RA

## 2022-12-26 NOTE — ED Notes (Signed)
See triage notes. Patient stated she stepped off a curb and her shoe and foot went different directions.

## 2022-12-28 DIAGNOSIS — S93402A Sprain of unspecified ligament of left ankle, initial encounter: Secondary | ICD-10-CM | POA: Diagnosis not present

## 2023-01-16 DIAGNOSIS — F4323 Adjustment disorder with mixed anxiety and depressed mood: Secondary | ICD-10-CM | POA: Diagnosis not present

## 2023-01-16 DIAGNOSIS — Z634 Disappearance and death of family member: Secondary | ICD-10-CM | POA: Diagnosis not present

## 2023-01-23 DIAGNOSIS — Z6841 Body Mass Index (BMI) 40.0 and over, adult: Secondary | ICD-10-CM | POA: Diagnosis not present

## 2023-01-23 DIAGNOSIS — Z713 Dietary counseling and surveillance: Secondary | ICD-10-CM | POA: Diagnosis not present

## 2023-01-23 DIAGNOSIS — E669 Obesity, unspecified: Secondary | ICD-10-CM | POA: Diagnosis not present

## 2023-01-23 DIAGNOSIS — R7303 Prediabetes: Secondary | ICD-10-CM | POA: Diagnosis not present

## 2023-01-30 DIAGNOSIS — Z634 Disappearance and death of family member: Secondary | ICD-10-CM | POA: Diagnosis not present

## 2023-01-30 DIAGNOSIS — F4323 Adjustment disorder with mixed anxiety and depressed mood: Secondary | ICD-10-CM | POA: Diagnosis not present

## 2023-02-04 DIAGNOSIS — Z713 Dietary counseling and surveillance: Secondary | ICD-10-CM | POA: Diagnosis not present

## 2023-02-04 DIAGNOSIS — Z6841 Body Mass Index (BMI) 40.0 and over, adult: Secondary | ICD-10-CM | POA: Diagnosis not present

## 2023-02-04 DIAGNOSIS — R7303 Prediabetes: Secondary | ICD-10-CM | POA: Diagnosis not present

## 2023-02-04 DIAGNOSIS — E669 Obesity, unspecified: Secondary | ICD-10-CM | POA: Diagnosis not present

## 2023-02-11 DIAGNOSIS — Z6841 Body Mass Index (BMI) 40.0 and over, adult: Secondary | ICD-10-CM | POA: Diagnosis not present

## 2023-02-11 DIAGNOSIS — R7303 Prediabetes: Secondary | ICD-10-CM | POA: Diagnosis not present

## 2023-02-11 DIAGNOSIS — E669 Obesity, unspecified: Secondary | ICD-10-CM | POA: Diagnosis not present

## 2023-02-11 DIAGNOSIS — Z713 Dietary counseling and surveillance: Secondary | ICD-10-CM | POA: Diagnosis not present

## 2023-02-13 DIAGNOSIS — Z634 Disappearance and death of family member: Secondary | ICD-10-CM | POA: Diagnosis not present

## 2023-02-13 DIAGNOSIS — F4323 Adjustment disorder with mixed anxiety and depressed mood: Secondary | ICD-10-CM | POA: Diagnosis not present

## 2023-02-18 DIAGNOSIS — E669 Obesity, unspecified: Secondary | ICD-10-CM | POA: Diagnosis not present

## 2023-02-18 DIAGNOSIS — Z713 Dietary counseling and surveillance: Secondary | ICD-10-CM | POA: Diagnosis not present

## 2023-02-18 DIAGNOSIS — R7303 Prediabetes: Secondary | ICD-10-CM | POA: Diagnosis not present

## 2023-02-18 DIAGNOSIS — Z6841 Body Mass Index (BMI) 40.0 and over, adult: Secondary | ICD-10-CM | POA: Diagnosis not present

## 2023-02-26 DIAGNOSIS — R7303 Prediabetes: Secondary | ICD-10-CM | POA: Diagnosis not present

## 2023-02-26 DIAGNOSIS — Z6841 Body Mass Index (BMI) 40.0 and over, adult: Secondary | ICD-10-CM | POA: Diagnosis not present

## 2023-02-26 DIAGNOSIS — Z713 Dietary counseling and surveillance: Secondary | ICD-10-CM | POA: Diagnosis not present

## 2023-02-26 DIAGNOSIS — E669 Obesity, unspecified: Secondary | ICD-10-CM | POA: Diagnosis not present

## 2023-03-05 DIAGNOSIS — Z6841 Body Mass Index (BMI) 40.0 and over, adult: Secondary | ICD-10-CM | POA: Diagnosis not present

## 2023-03-05 DIAGNOSIS — E669 Obesity, unspecified: Secondary | ICD-10-CM | POA: Diagnosis not present

## 2023-03-05 DIAGNOSIS — Z713 Dietary counseling and surveillance: Secondary | ICD-10-CM | POA: Diagnosis not present

## 2023-03-05 DIAGNOSIS — R7303 Prediabetes: Secondary | ICD-10-CM | POA: Diagnosis not present

## 2023-03-08 DIAGNOSIS — Z634 Disappearance and death of family member: Secondary | ICD-10-CM | POA: Diagnosis not present

## 2023-03-08 DIAGNOSIS — F4323 Adjustment disorder with mixed anxiety and depressed mood: Secondary | ICD-10-CM | POA: Diagnosis not present

## 2023-03-14 ENCOUNTER — Encounter: Payer: Self-pay | Admitting: Urgent Care

## 2023-03-14 ENCOUNTER — Ambulatory Visit: Payer: BC Managed Care – PPO | Admitting: Urgent Care

## 2023-03-14 VITALS — BP 112/76 | HR 83 | Temp 98.2°F | Ht 64.0 in | Wt 258.6 lb

## 2023-03-14 DIAGNOSIS — R5383 Other fatigue: Secondary | ICD-10-CM

## 2023-03-14 DIAGNOSIS — I781 Nevus, non-neoplastic: Secondary | ICD-10-CM

## 2023-03-14 DIAGNOSIS — L680 Hirsutism: Secondary | ICD-10-CM

## 2023-03-14 DIAGNOSIS — S93402D Sprain of unspecified ligament of left ankle, subsequent encounter: Secondary | ICD-10-CM

## 2023-03-14 DIAGNOSIS — S93402A Sprain of unspecified ligament of left ankle, initial encounter: Secondary | ICD-10-CM | POA: Insufficient documentation

## 2023-03-14 DIAGNOSIS — L83 Acanthosis nigricans: Secondary | ICD-10-CM

## 2023-03-14 DIAGNOSIS — E039 Hypothyroidism, unspecified: Secondary | ICD-10-CM | POA: Diagnosis not present

## 2023-03-14 DIAGNOSIS — R635 Abnormal weight gain: Secondary | ICD-10-CM

## 2023-03-14 DIAGNOSIS — L906 Striae atrophicae: Secondary | ICD-10-CM

## 2023-03-14 DIAGNOSIS — B019 Varicella without complication: Secondary | ICD-10-CM | POA: Insufficient documentation

## 2023-03-14 DIAGNOSIS — Z6841 Body Mass Index (BMI) 40.0 and over, adult: Secondary | ICD-10-CM

## 2023-03-14 NOTE — Progress Notes (Signed)
New Patient Office Visit  Subjective:  Patient ID: Kristie Santiago, female    DOB: 1990-11-26  Age: 32 y.o. MRN: [id number]  CC:  Chief Complaint  Patient presents with   Establish Care    New pt est care and discuss weight loss options.    HPI Kristie Santiago presents to establish care.  Pleasant 32yo female presents as a new pt. She is 9 months postpartum with a history of gestational diabetes and hypothyroidism. She presents for primary care establishment and weight management concerns. She reports a longstanding struggle with weight loss, despite consistent efforts with dieting and exercise. States she has counted calories in the past and even paid for a personal trainer, but reports no more than 10# of weight loss despite nearly one year of personal training. The patient expresses a heightened motivation for health improvement following the birth of her child nine months ago and a recent family history of cardiac issues.  Prior to her pregnancy, the patient's weight was approximately 240 pounds, and she denies that her current weight is solely postpartum gain. She has not previously sought medical interventions for weight loss, but expresses a desire to explore available options. The patient also reports a history of irregular menstrual cycles, both before and after discontinuing birth control two years ago. She experienced a year-long delay in achieving pregnancy, although she was not actively tracking ovulation during this time.  The patient was diagnosed with thyroid dysfunction by her OB-GYN in 2022 and was started on levothyroxine. She reports minimal improvement in fatigue since starting the medication. The patient denies any prior evaluation by an endocrinologist or imaging of the thyroid.  In addition to these concerns, the patient reports a recent ankle sprain that occurred approximately 12 weeks ago. Despite treatment with a brace and boot, she notes persistent swelling and  tenderness in the L lateral ankle. The patient is considering further evaluation.  The patient's current medications include levothyroxine and occasional use of Advil for headaches. She denies any current use of birth control or hydrocodone, which was previously prescribed for the ankle injury. The patient reports normal bowel movements and denies any issues with blood pressure. She sleeps approximately eight hours per night, with occasional interruptions from her infant. She is not currently breastfeeding; pt reported issues with milk supply.    Outpatient Encounter Medications as of 03/14/2023  Medication Sig   cetirizine (ZYRTEC) 10 MG tablet Take by mouth.   levothyroxine (SYNTHROID) 88 MCG tablet Take by mouth.   [DISCONTINUED] ibuprofen (ADVIL) 200 MG tablet Take 200 mg by mouth every 6 (six) hours as needed for mild pain (pain score 1-3).   [DISCONTINUED] loratadine (CLARITIN) 10 MG tablet Take by mouth.   [DISCONTINUED] cetirizine (ZYRTEC) 5 MG tablet Take 1 tablet by mouth daily.   [DISCONTINUED] Continuous Glucose Sensor (DEXCOM G7 SENSOR) MISC Use 1 Device continuously (Patient not taking: Reported on 03/14/2023)   [DISCONTINUED] HYDROcodone-acetaminophen (NORCO/VICODIN) 5-325 MG tablet Take 1 tablet by mouth every 6 (six) hours as needed for severe pain. (Patient not taking: Reported on 03/14/2023)   [DISCONTINUED] ibuprofen (ADVIL) 600 MG tablet Take 1 tablet by mouth every 6 (six) hours as needed. (Patient not taking: Reported on 03/14/2023)   [DISCONTINUED] levonorgestrel-ethinyl estradiol (SRONYX) 0.1-20 MG-MCG tablet every evening. (Patient not taking: Reported on 03/14/2023)   [DISCONTINUED] levothyroxine (SYNTHROID) 50 MCG tablet Take 1 tablet (50 mcg total) by mouth daily before breakfast. (Patient not taking: Reported on 03/14/2023)   [DISCONTINUED] loratadine (CLARITIN) 10  MG tablet Take by mouth.   No facility-administered encounter medications on file as of 03/14/2023.     History reviewed. No pertinent past medical history.  Past Surgical History:  Procedure Laterality Date   CESAREAN SECTION  2024   CHOLECYSTECTOMY  2018   WISDOM TOOTH EXTRACTION      Family History  Problem Relation Age of Onset   Heart disease Father    Hearing loss Father    Mental illness Maternal Grandmother    Alzheimer's disease Maternal Grandmother    Diabetes Paternal Grandmother    Cancer Paternal Grandfather    Diabetes Paternal Grandfather     Social History   Socioeconomic History   Marital status: Married    Spouse name: Not on file   Number of children: 1   Years of education: Not on file   Highest education level: Some college, no degree  Occupational History   Not on file  Tobacco Use   Smoking status: Never   Smokeless tobacco: Never  Vaping Use   Vaping status: Never Used  Substance and Sexual Activity   Alcohol use: Yes   Drug use: No   Sexual activity: Yes    Partners: Male    Birth control/protection: None  Other Topics Concern   Not on file  Social History Narrative   Not on file   Social Determinants of Health   Financial Resource Strain: Not on file  Food Insecurity: Low Risk  (06/22/2022)   [facility], [facility]   Food Insecurity    Within the past 12 months, did the food you bought just not last and you didn't have money to get more?: No    Within the past 12 months, did you worry that your food would run out before you got money to buy more?: No  Transportation Needs: Low Risk  (06/22/2022)   Received [facility], [facility]   Transportation Needs    Within the past 12 months, has a lack of transportation kept you from medical appointments or from doing things needed for daily living?: No  Physical Activity: Not on file  Stress: Not on file  Social Connections: Unknown (09/08/2021)   Received from [facility], [facility]   Social Network     Social Network: Not on file  Intimate Partner Violence: Unknown (08/07/2021)   Received from [facility], [facility]   HITS    Physically Hurt: Not on file    Insult or Talk Down To: Not on file    Threaten Physical Harm: Not on file    Scream or Curse: Not on file    ROS: as noted in HPI  Objective:  BP 112/76   Pulse 83   Temp 98.2 F (36.8 C) (Oral)   Ht 5\' 4"  (1.626 m)   Wt 258 lb 9.6 oz (117.3 kg)   SpO2 96%   BMI 44.39 kg/m   Physical Exam Vitals and nursing note reviewed.  Constitutional:      General: She is not in acute distress.    Appearance: Normal appearance. She is obese. She is not ill-appearing, toxic-appearing or diaphoretic.  HENT:     Head: Normocephalic and atraumatic.     Right Ear: Tympanic membrane, ear canal and external ear normal. There is no impacted cerumen.     Left Ear: Tympanic membrane, ear canal and external ear normal. There is no impacted cerumen.  Nose: Nose normal.     Mouth/Throat:     Mouth: Mucous membranes are moist.     Pharynx: Oropharynx is clear. No oropharyngeal exudate or posterior oropharyngeal erythema.  Eyes:     General: No scleral icterus.       Right eye: No discharge.        Left eye: No discharge.     Extraocular Movements: Extraocular movements intact.     Pupils: Pupils are equal, round, and reactive to light.  Neck:     Thyroid: No thyroid mass, thyromegaly or thyroid tenderness.     Comments: Acanthosis nigricans to nape of neck; mild buffalo hump Cardiovascular:     Rate and Rhythm: Normal rate and regular rhythm.     Pulses: Normal pulses.     Heart sounds: No murmur heard. Pulmonary:     Effort: Pulmonary effort is normal. No respiratory distress.     Breath sounds: Normal breath sounds. No stridor. No wheezing or rhonchi.  Abdominal:     General: Abdomen is flat. Bowel sounds are normal. There is no distension.     Palpations: Abdomen is soft. There is no mass.     Tenderness: There is no  abdominal tenderness. There is no right CVA tenderness, left CVA tenderness, guarding or rebound.     Comments: Violaceous abdominal striae  Musculoskeletal:     Cervical back: Normal range of motion and neck supple. No rigidity or tenderness.     Right lower leg: No edema.     Left lower leg: No edema.  Lymphadenopathy:     Cervical: No cervical adenopathy.  Skin:    General: Skin is warm and dry.     Coloration: Skin is not jaundiced.     Findings: No bruising, erythema or rash.     Comments: Mild acne to face with wildly distributed telangiectasias, most notable to B cheeks and chin Hirsutism noted to chin/ superior neck  Neurological:     General: No focal deficit present.     Mental Status: She is alert and oriented to person, place, and time.     Sensory: No sensory deficit.     Motor: No weakness.  Psychiatric:        Mood and Affect: Mood normal.        Behavior: Behavior normal.      Last hemoglobin A1c Lab Results  Component Value Date   HGBA1C 5.3 04/26/2021   Last thyroid functions Lab Results  Component Value Date   TSH 0.385 (L) 07/19/2021   T4TOTAL 10.8 07/19/2021      Assessment & Plan:  Hypothyroidism, unspecified type Assessment & Plan: Hypothyroidism On levothyroxine with variable response. No prior evaluation for cause of hypothyroidism. Never seen endo. Feels no different on the medication than prior to starting. -Order thyroid peroxidase (TPO) antibody to evaluate for possible Hashimoto's thyroiditis. -Consider further evaluation with thyroid ultrasound if abnormal labs -Consider switching to Armour Thyroid if patient does not report improvement in symptoms despite normalization of labs.  Orders: -     CBC with Differential/Platelet; Future -     Comprehensive metabolic panel; Future -     TSH; Future -     T3, free; Future -     T4, free; Future -     Thyroglobulin antibody; Future -     Thyroid Peroxidase Antibodies (TPO) (REFL);  Future  Acanthosis nigricans -     ACTH; Future -     Cortisol; Future -  Testosterone; Future -     FSH/LH; Future -     Estradiol; Future -     17-Hydroxyprogesterone; Future -     Cortisol,free,24 hour urine w/creatinine; Future -     Prolactin; Future -     Comprehensive metabolic panel; Future -     Cardio IQ Insulin Resistance Panel with Score; Future  Abnormal weight gain Assessment & Plan: Weight Management Difficulty with weight loss despite diet and exercise. History of gestational diabetes. Possible insulin resistance suggested by acanthosis nigricans. Pt also has numerous physical characteristics concerning for Cushing syndrome. -Order hormonal labs to evaluate for possible polycystic ovarian syndrome (PCOS) or Cushing's syndrome. -Check insulin level. -24 hour urine cortisol level  Orders: -     ACTH; Future -     Cortisol; Future -     Testosterone; Future -     FSH/LH; Future -     Estradiol; Future -     17-Hydroxyprogesterone; Future -     Cortisol,free,24 hour urine w/creatinine; Future -     Prolactin; Future -     CBC with Differential/Platelet; Future -     Comprehensive metabolic panel; Future -     TSH; Future -     T3, free; Future -     T4, free; Future -     Thyroglobulin antibody; Future -     Thyroid Peroxidase Antibodies (TPO) (REFL); Future -     Cardio IQ Insulin Resistance Panel with Score; Future  Hirsutism Assessment & Plan: Will repeat PCOS workup as pt had not been off OCPs for long prior to her last hormone workup  Orders: -     ACTH; Future -     Cortisol; Future -     Testosterone; Future -     FSH/LH; Future -     Estradiol; Future -     17-Hydroxyprogesterone; Future -     Cortisol,free,24 hour urine w/creatinine; Future -     Prolactin; Future -     TSH; Future -     T3, free; Future -     T4, free; Future -     Thyroglobulin antibody; Future -     Thyroid Peroxidase Antibodies (TPO) (REFL); Future -     Cardio IQ  Insulin Resistance Panel with Score; Future  Striae purple -     ACTH; Future -     Cortisol; Future -     Testosterone; Future -     FSH/LH; Future -     Estradiol; Future -     17-Hydroxyprogesterone; Future -     Cortisol,free,24 hour urine w/creatinine; Future -     Prolactin; Future -     Cardio IQ Insulin Resistance Panel with Score; Future  Telangiectasias -     ACTH; Future -     Cortisol; Future -     Testosterone; Future -     FSH/LH; Future -     Estradiol; Future -     17-Hydroxyprogesterone; Future -     Cortisol,free,24 hour urine w/creatinine; Future -     Prolactin; Future -     Cardio IQ Insulin Resistance Panel with Score; Future  BMI 40.0-44.9, adult (HCC) -     ACTH; Future -     Cortisol; Future -     Testosterone; Future -     FSH/LH; Future -     Estradiol; Future -     17-Hydroxyprogesterone; Future -     Cortisol,free,24 hour  urine w/creatinine; Future -     Prolactin; Future -     TSH; Future -     T3, free; Future -     T4, free; Future -     Thyroglobulin antibody; Future -     Thyroid Peroxidase Antibodies (TPO) (REFL); Future -     Cardio IQ Insulin Resistance Panel with Score; Future  Fatigue, unspecified type -     Cortisol; Future -     Testosterone; Future -     CBC with Differential/Platelet; Future -     Comprehensive metabolic panel; Future -     TSH; Future -     T3, free; Future -     T4, free; Future -     Thyroglobulin antibody; Future -     Thyroid Peroxidase Antibodies (TPO) (REFL); Future -     Cardio IQ Insulin Resistance Panel with Score; Future  Sprain of left ankle, unspecified ligament, subsequent encounter Assessment & Plan: Ankle Sprain Persistent swelling and tenderness 12 weeks post-injury. X-ray showed no bony abnormality back in August. Pt did wear boot and ankle brace for some time, with no improvement. -Discussed possible ligamentous tear and need for further evaluation with MRI. -Patient to consider  referral to sports medicine specialist for further evaluation and possible intervention. Pt deferring this currently but will notify in near future if referral desired. -Consider accupuncture   Pt here with long-standing weight issues. Had a workup for PCOS in the past. Has mild hypothyroidism. Pt has clinical findings concerning for Cushing syndrome despite her normal blood pressure. Will have her return for comprehensive workup to include fasting labs and a 24-hour urine cortisol level. Will also perform workup for her known thyroid d/o. Pt will return for 2 week follow up after labs obtained to discuss tx interventions. Regarding the ankle, recommended referral to sports medicine or possible [facility]/ acupuncture. Pt deferred at the moment but will notify if referral needed.    Total time spent with patient including face to face time, lab/ chart review, documentation and plan of care was 45 minutes.  Return in about 5 days (around 03/19/2023) for for fasting labs;.   Maretta Bees, PA

## 2023-03-14 NOTE — Assessment & Plan Note (Signed)
Ankle Sprain Persistent swelling and tenderness 12 weeks post-injury. X-ray showed no bony abnormality back in August. Pt did wear boot and ankle brace for some time, with no improvement. -Discussed possible ligamentous tear and need for further evaluation with MRI. -Patient to consider referral to sports medicine specialist for further evaluation and possible intervention. Pt deferring this currently but will notify in near future if referral desired. -Consider accupuncture

## 2023-03-14 NOTE — Patient Instructions (Signed)
Return on 03/19/23 for fasting lab draw. We will further assess for any hormonal or metabolic causes of your weight issues. Continue your levothyroxine as previously prescribed for now. Follow up 2 weeks after labs to discuss results and interventions.  Consider referral to sports med should your left ankle continue to give you problems.

## 2023-03-14 NOTE — Assessment & Plan Note (Signed)
Hypothyroidism On levothyroxine with variable response. No prior evaluation for cause of hypothyroidism. Never seen endo. Feels no different on the medication than prior to starting. -Order thyroid peroxidase (TPO) antibody to evaluate for possible Hashimoto's thyroiditis. -Consider further evaluation with thyroid ultrasound if abnormal labs -Consider switching to Armour Thyroid if patient does not report improvement in symptoms despite normalization of labs.

## 2023-03-14 NOTE — Assessment & Plan Note (Signed)
Will repeat PCOS workup as pt had not been off OCPs for long prior to her last hormone workup

## 2023-03-14 NOTE — Assessment & Plan Note (Signed)
Weight Management Difficulty with weight loss despite diet and exercise. History of gestational diabetes. Possible insulin resistance suggested by acanthosis nigricans. Pt also has numerous physical characteristics concerning for Cushing syndrome. -Order hormonal labs to evaluate for possible polycystic ovarian syndrome (PCOS) or Cushing's syndrome. -Check insulin level. -24 hour urine cortisol level

## 2023-03-15 ENCOUNTER — Encounter: Payer: Self-pay | Admitting: Urgent Care

## 2023-03-19 ENCOUNTER — Other Ambulatory Visit (INDEPENDENT_AMBULATORY_CARE_PROVIDER_SITE_OTHER): Payer: BC Managed Care – PPO

## 2023-03-19 DIAGNOSIS — R635 Abnormal weight gain: Secondary | ICD-10-CM | POA: Diagnosis not present

## 2023-03-19 DIAGNOSIS — Z6841 Body Mass Index (BMI) 40.0 and over, adult: Secondary | ICD-10-CM | POA: Diagnosis not present

## 2023-03-19 DIAGNOSIS — R5383 Other fatigue: Secondary | ICD-10-CM | POA: Diagnosis not present

## 2023-03-19 DIAGNOSIS — E039 Hypothyroidism, unspecified: Secondary | ICD-10-CM | POA: Diagnosis not present

## 2023-03-19 DIAGNOSIS — L680 Hirsutism: Secondary | ICD-10-CM

## 2023-03-19 DIAGNOSIS — L83 Acanthosis nigricans: Secondary | ICD-10-CM | POA: Diagnosis not present

## 2023-03-19 DIAGNOSIS — L906 Striae atrophicae: Secondary | ICD-10-CM

## 2023-03-19 DIAGNOSIS — I781 Nevus, non-neoplastic: Secondary | ICD-10-CM

## 2023-03-19 LAB — CBC WITH DIFFERENTIAL/PLATELET
Basophils Absolute: 0 10*3/uL (ref 0.0–0.1)
Basophils Relative: 0.4 % (ref 0.0–3.0)
Eosinophils Absolute: 0.2 10*3/uL (ref 0.0–0.7)
Eosinophils Relative: 1.7 % (ref 0.0–5.0)
HCT: 43.5 % (ref 36.0–46.0)
Hemoglobin: 14.5 g/dL (ref 12.0–15.0)
Lymphocytes Relative: 23.5 % (ref 12.0–46.0)
Lymphs Abs: 2.2 10*3/uL (ref 0.7–4.0)
MCHC: 33.4 g/dL (ref 30.0–36.0)
MCV: 84.3 fL (ref 78.0–100.0)
Monocytes Absolute: 0.6 10*3/uL (ref 0.1–1.0)
Monocytes Relative: 5.9 % (ref 3.0–12.0)
Neutro Abs: 6.4 10*3/uL (ref 1.4–7.7)
Neutrophils Relative %: 68.5 % (ref 43.0–77.0)
Platelets: 407 10*3/uL — ABNORMAL HIGH (ref 150.0–400.0)
RBC: 5.16 Mil/uL — ABNORMAL HIGH (ref 3.87–5.11)
RDW: 14 % (ref 11.5–15.5)
WBC: 9.4 10*3/uL (ref 4.0–10.5)

## 2023-03-19 LAB — TSH: TSH: 2.87 u[IU]/mL (ref 0.35–5.50)

## 2023-03-19 LAB — COMPREHENSIVE METABOLIC PANEL
ALT: 19 U/L (ref 0–35)
AST: 16 U/L (ref 0–37)
Albumin: 4.2 g/dL (ref 3.5–5.2)
Alkaline Phosphatase: 97 U/L (ref 39–117)
BUN: 13 mg/dL (ref 6–23)
CO2: 29 meq/L (ref 19–32)
Calcium: 9.3 mg/dL (ref 8.4–10.5)
Chloride: 104 meq/L (ref 96–112)
Creatinine, Ser: 0.65 mg/dL (ref 0.40–1.20)
GFR: 116.23 mL/min (ref >60.00)
Glucose, Bld: 87 mg/dL (ref 70–99)
Potassium: 4.3 meq/L (ref 3.5–5.1)
Sodium: 140 meq/L (ref 135–145)
Total Bilirubin: 0.4 mg/dL (ref 0.2–1.2)
Total Protein: 7.6 g/dL (ref 6.0–8.3)

## 2023-03-19 LAB — CORTISOL: Cortisol, Plasma: 4.6 ug/dL

## 2023-03-19 LAB — T3, FREE: T3, Free: 4.2 pg/mL (ref 2.3–4.2)

## 2023-03-19 LAB — T4, FREE: Free T4: 0.98 ng/dL (ref 0.60–1.60)

## 2023-03-19 LAB — TESTOSTERONE: Testosterone: 37.2 ng/dL (ref 15.00–40.00)

## 2023-03-25 DIAGNOSIS — L680 Hirsutism: Secondary | ICD-10-CM | POA: Diagnosis not present

## 2023-03-25 DIAGNOSIS — R635 Abnormal weight gain: Secondary | ICD-10-CM | POA: Diagnosis not present

## 2023-03-25 DIAGNOSIS — L83 Acanthosis nigricans: Secondary | ICD-10-CM | POA: Diagnosis not present

## 2023-03-25 DIAGNOSIS — L906 Striae atrophicae: Secondary | ICD-10-CM | POA: Diagnosis not present

## 2023-03-26 DIAGNOSIS — Z6841 Body Mass Index (BMI) 40.0 and over, adult: Secondary | ICD-10-CM | POA: Diagnosis not present

## 2023-03-26 DIAGNOSIS — Z713 Dietary counseling and surveillance: Secondary | ICD-10-CM | POA: Diagnosis not present

## 2023-03-26 DIAGNOSIS — R7303 Prediabetes: Secondary | ICD-10-CM | POA: Diagnosis not present

## 2023-03-26 DIAGNOSIS — E669 Obesity, unspecified: Secondary | ICD-10-CM | POA: Diagnosis not present

## 2023-03-26 LAB — EXTRA SPECIMEN

## 2023-03-26 LAB — ESTRADIOL: Estradiol: 175 pg/mL

## 2023-03-26 LAB — THYROID PEROXIDASE ANTIBODIES (TPO) (REFL): Thyroperoxidase Ab SerPl-aCnc: 518 [IU]/mL — ABNORMAL HIGH

## 2023-03-26 LAB — CARDIO IQ INSULIN RESISTANCE PANEL WITH SCORE
C-PEPTIDE, LC/MS/MS: 2.8 ng/mL — ABNORMAL HIGH (ref 0.68–2.16)
INSULIN, INTACT, LC/MS/MS: 12 u[IU]/mL (ref <=16)
Insulin Resistance Score: 74 — ABNORMAL HIGH (ref <=66)

## 2023-03-26 LAB — ACTH: C206 ACTH: 12 pg/mL (ref 6–50)

## 2023-03-26 LAB — FSH/LH
FSH: 7.5 m[IU]/mL
LH: 26.6 m[IU]/mL

## 2023-03-26 LAB — 17-HYDROXYPROGESTERONE: 17-OH-Progesterone, LC/MS/MS: 39 ng/dL

## 2023-03-26 LAB — THYROGLOBULIN ANTIBODY: Thyroglobulin Ab: 1 [IU]/mL (ref <=1)

## 2023-03-26 LAB — PROLACTIN: Prolactin: 6.1 ng/mL

## 2023-03-29 LAB — CORTISOL, URINE, 24 HOUR

## 2023-04-11 ENCOUNTER — Encounter: Payer: Self-pay | Admitting: Urgent Care

## 2023-04-11 ENCOUNTER — Ambulatory Visit: Payer: BC Managed Care – PPO | Admitting: Urgent Care

## 2023-04-11 VITALS — BP 113/76 | HR 73 | Wt 268.8 lb

## 2023-04-11 DIAGNOSIS — E88819 Insulin resistance, unspecified: Secondary | ICD-10-CM | POA: Diagnosis not present

## 2023-04-11 DIAGNOSIS — Z8249 Family history of ischemic heart disease and other diseases of the circulatory system: Secondary | ICD-10-CM | POA: Insufficient documentation

## 2023-04-11 DIAGNOSIS — D75839 Thrombocytosis, unspecified: Secondary | ICD-10-CM

## 2023-04-11 DIAGNOSIS — R635 Abnormal weight gain: Secondary | ICD-10-CM | POA: Diagnosis not present

## 2023-04-11 DIAGNOSIS — Z6841 Body Mass Index (BMI) 40.0 and over, adult: Secondary | ICD-10-CM

## 2023-04-11 DIAGNOSIS — L906 Striae atrophicae: Secondary | ICD-10-CM

## 2023-04-11 DIAGNOSIS — I781 Nevus, non-neoplastic: Secondary | ICD-10-CM

## 2023-04-11 DIAGNOSIS — R5383 Other fatigue: Secondary | ICD-10-CM

## 2023-04-11 DIAGNOSIS — E063 Autoimmune thyroiditis: Secondary | ICD-10-CM

## 2023-04-11 DIAGNOSIS — L83 Acanthosis nigricans: Secondary | ICD-10-CM | POA: Diagnosis not present

## 2023-04-11 DIAGNOSIS — L680 Hirsutism: Secondary | ICD-10-CM

## 2023-04-11 MED ORDER — METFORMIN HCL 500 MG PO TABS: 500.0000 mg | ORAL_TABLET | Freq: Every day | ORAL | 0 refills | Status: DC

## 2023-04-11 NOTE — Progress Notes (Signed)
Established Patient Office Visit  Subjective:  Patient ID: Kristie Santiago, female    DOB: 1991-04-25  Age: 32 y.o. MRN: [id number]  Chief Complaint  Patient presents with   Follow-up    Pt here to discuss recent labs.     32yo female with a known history of thyroid issues, presented for a follow-up consultation after recent lab results. The patient reported feeling fatigued and washed out, chronically tired. Denies hair or skin changes. However, she was unsure if these symptoms were due to her thyroid condition or the demands of being a new parent. The patient also reported feeling more tired after a night of interrupted sleep, despite her infant son generally sleeping through the night.  The patient's lab results indicated an elevated TPO or thyroid peroxidase level, suggesting an autoimmune cause for her thyroid issues. However, the patient still reported symptoms of fatigue and other changes.  In addition to the thyroid issues, the patient's lab results also indicated insulin resistance. The patient's insulin level was normal, but the C peptide level was high. The patient did not report any specific symptoms related to this condition. Diabetes mellitus does run in her family.  The patient also expressed concern about a family history of heart disease and type 2 diabetes. She reported that her father and uncle both had heart procedures and passed away from presumed heart-related issues. The patient expressed a desire to prevent similar health issues in her own life.  The patient's platelet count was slightly above normal, suspected to be reactive. The patient's cortisol levels were also tested to rule out Cushing's disease, but the results were inconclusive due to a lack of volume provided for the 24-hour urine cortisol test. Endocrine referral discussed.     Patient Active Problem List   Diagnosis Date Noted   Insulin resistance 04/11/2023   Thrombocytosis 04/11/2023   Family  history of cardiovascular disease 04/11/2023   Chicken pox 03/14/2023   Acanthosis nigricans 03/14/2023   Abnormal weight gain 03/14/2023   Hirsutism 03/14/2023   Striae purple 03/14/2023   Telangiectasias 03/14/2023   BMI 40.0-44.9, adult (HCC) 03/14/2023   Fatigue 03/14/2023   Sprain of left ankle 03/14/2023   Hypothyroidism 05/23/2021   History reviewed. No pertinent past medical history. Past Surgical History:  Procedure Laterality Date   CESAREAN SECTION  2024   CHOLECYSTECTOMY  2018   WISDOM TOOTH EXTRACTION     Social History   Tobacco Use   Smoking status: Never   Smokeless tobacco: Never  Vaping Use   Vaping status: Never Used  Substance Use Topics   Alcohol use: Yes   Drug use: No      ROS: as noted in HPI  Objective:     BP 113/76   Pulse 73   Wt 268 lb 12.8 oz (121.9 kg)   SpO2 97%   BMI 46.14 kg/m  BP Readings from Last 3 Encounters:  04/11/23 113/76  03/14/23 112/76  12/26/22 (!) 120/53   Wt Readings from Last 3 Encounters:  04/11/23 268 lb 12.8 oz (121.9 kg)  03/14/23 258 lb 9.6 oz (117.3 kg)  10/09/22 238 lb (108 kg)      Physical Exam Vitals and nursing note reviewed.  Constitutional:      General: She is not in acute distress.    Appearance: Normal appearance. She is not ill-appearing, toxic-appearing or diaphoretic.  HENT:     Head: Normocephalic and atraumatic.  Eyes:     General: No scleral icterus.  Right eye: No discharge.        Left eye: No discharge.     Extraocular Movements: Extraocular movements intact.     Pupils: Pupils are equal, round, and reactive to light.  Cardiovascular:     Rate and Rhythm: Normal rate.  Pulmonary:     Effort: Pulmonary effort is normal. No respiratory distress.  Skin:    General: Skin is warm and dry.     Coloration: Skin is not jaundiced.     Findings: No bruising or erythema.  Neurological:     General: No focal deficit present.     Mental Status: She is alert and oriented to  person, place, and time.     Gait: Gait normal.  Psychiatric:        Mood and Affect: Mood normal.        Behavior: Behavior normal.      No results found for any visits on 04/11/23.  Last CBC Lab Results  Component Value Date   WBC 9.4 03/19/2023   HGB 14.5 03/19/2023   HCT 43.5 03/19/2023   MCV 84.3 03/19/2023   RDW 14.0 03/19/2023   PLT 407.0 (H) 03/19/2023   Last metabolic panel Lab Results  Component Value Date   GLUCOSE 87 03/19/2023   NA 140 03/19/2023   K 4.3 03/19/2023   CL 104 03/19/2023   CO2 29 03/19/2023   BUN 13 03/19/2023   CREATININE 0.65 03/19/2023   GFR 116.23 03/19/2023   CALCIUM 9.3 03/19/2023   PROT 7.6 03/19/2023   ALBUMIN 4.2 03/19/2023   BILITOT 0.4 03/19/2023   ALKPHOS 97 03/19/2023   AST 16 03/19/2023   ALT 19 03/19/2023   Last lipids No results found for: "CHOL", "HDL", "LDLCALC", "LDLDIRECT", "TRIG", "CHOLHDL" Last hemoglobin A1c Lab Results  Component Value Date   HGBA1C 5.3 04/26/2021   Last thyroid functions Lab Results  Component Value Date   TSH 2.87 03/19/2023   T4TOTAL 10.8 07/19/2021   Last vitamin D No results found for: "25OHVITD2", "25OHVITD3", "VD25OH" Last vitamin B12 and Folate No results found for: "VITAMINB12", "FOLATE"    The ASCVD Risk score (Arnett DK, et al., 2019) failed to calculate for the following reasons:   The 2019 ASCVD risk score is only valid for ages 24 to 32  Assessment & Plan:  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 Discussed the autoimmune nature of the condition and the potential for symptoms despite normal thyroid function tests. -Continue Levothyroxine. -Consider switching to armour thyroid or possibly adding low-dose naltrexone if fatigue does not improve on metformin alone   Orders: -     Ambulatory referral to Endocrinology  Insulin  resistance Assessment & Plan: Insulin Resistance High C-peptide indicating overproduction of insulin despite normal insulin levels. Discussed the potential for future development of diabetes and the nonspecific symptoms associated with insulin resistance. -Start Metformin 500mg  daily with breakfast to improve insulin utilization and reduce insulin production. -Recheck insulin level in 6 months.  Orders: -     Ambulatory referral to Endocrinology -     metFORMIN HCl; Take 1 tablet (500 mg total) by mouth daily with breakfast.  Dispense: 90 tablet; Refill: 0  Acanthosis nigricans -     metFORMIN HCl; Take 1 tablet (500 mg total) by mouth daily with breakfast.  Dispense: 90 tablet; Refill: 0  Abnormal weight gain -     Ambulatory referral to Endocrinology -  metFORMIN HCl; Take 1 tablet (500 mg total) by mouth daily with breakfast.  Dispense: 90 tablet; Refill: 0  Hirsutism -     Ambulatory referral to Endocrinology  Striae purple Assessment & Plan: Given patients current sx and PE findings, despite normal cortisol level in labs, will refer to endocrine to see if more specific testing is indicated  Orders: -     Ambulatory referral to Endocrinology  Telangiectasias -     Ambulatory referral to Endocrinology  Fatigue, unspecified type  BMI 40.0-44.9, adult (HCC) -     metFORMIN HCl; Take 1 tablet (500 mg total) by mouth daily with breakfast.  Dispense: 90 tablet; Refill: 0  Thrombocytosis Assessment & Plan: Thrombocytosis Slightly elevated platelets. Discussed the plan for monitoring. -Recheck platelet count in 4-6 months unless symptomatic.   Family history of cardiovascular disease Assessment & Plan: Discussed family history of cardiovascular disease and the patient's potential risk factors. Current cholesterol levels (from 2020) are within normal limits. -Encourage cardiovascular activity and consider over-the-counter supplements such as fish oil and coenzyme Q10 to  increase HDL. -Recheck lipid panel in 6 months fasting      Return in about 6 months (around 10/10/2023).   Maretta Bees, PA

## 2023-04-11 NOTE — Assessment & Plan Note (Signed)
Thrombocytosis Slightly elevated platelets. Discussed the plan for monitoring. -Recheck platelet count in 4-6 months unless symptomatic.

## 2023-04-11 NOTE — Assessment & Plan Note (Signed)
Autoimmune Thyroiditis Elevated TPO antibodies (518) with normal thyroid function tests on Levothyroxine. Discussed the autoimmune nature of the condition and the potential for symptoms despite normal thyroid function tests. -Continue Levothyroxine. -Consider switching to armour thyroid or possibly adding low-dose naltrexone if fatigue does not improve on metformin alone

## 2023-04-11 NOTE — Assessment & Plan Note (Signed)
Discussed family history of cardiovascular disease and the patient's potential risk factors. Current cholesterol levels (from 2020) are within normal limits. -Encourage cardiovascular activity and consider over-the-counter supplements such as fish oil and coenzyme Q10 to increase HDL. -Recheck lipid panel in 6 months fasting

## 2023-04-11 NOTE — Assessment & Plan Note (Signed)
Given patients current sx and PE findings, despite normal cortisol level in labs, will refer to endocrine to see if more specific testing is indicated

## 2023-04-11 NOTE — Patient Instructions (Signed)
Start metformin 500mg  once daily with breakfast. Please make sure to take with food. If you develop abdominal cramping or diarrhea, try to continue for at least two weeks as these symptoms generally subside. If you develop these symptoms at they do not resolve after two weeks, notify our office for a possible change to the ER form.  Consider an OTC fish oil supplement to improve your HDL levels. We will check your cholesterol fasting in 6 months.  Continue your levothyroxine as previously ordered. Monitor your fatigue and see if addition of metformin helps with this. Other thyroid options discussed today was addition of low-dose naltrexone to treat your thyroid antibodies, or switching to armour thyroid.  Please schedule a consultation with endocrine to see if an additional workup is indicated with more specific tests.

## 2023-04-11 NOTE — Assessment & Plan Note (Signed)
Insulin Resistance High C-peptide indicating overproduction of insulin despite normal insulin levels. Discussed the potential for future development of diabetes and the nonspecific symptoms associated with insulin resistance. -Start Metformin 500mg  daily with breakfast to improve insulin utilization and reduce insulin production. -Recheck insulin level in 6 months.

## 2023-04-18 DIAGNOSIS — Z634 Disappearance and death of family member: Secondary | ICD-10-CM | POA: Diagnosis not present

## 2023-04-18 DIAGNOSIS — F4323 Adjustment disorder with mixed anxiety and depressed mood: Secondary | ICD-10-CM | POA: Diagnosis not present

## 2023-05-16 DIAGNOSIS — Z634 Disappearance and death of family member: Secondary | ICD-10-CM | POA: Diagnosis not present

## 2023-05-16 DIAGNOSIS — F4323 Adjustment disorder with mixed anxiety and depressed mood: Secondary | ICD-10-CM | POA: Diagnosis not present

## 2023-05-18 DIAGNOSIS — Z713 Dietary counseling and surveillance: Secondary | ICD-10-CM | POA: Diagnosis not present

## 2023-05-29 ENCOUNTER — Other Ambulatory Visit: Payer: BC Managed Care – PPO

## 2023-06-03 ENCOUNTER — Other Ambulatory Visit: Payer: BC Managed Care – PPO

## 2023-06-03 ENCOUNTER — Ambulatory Visit: Payer: BC Managed Care – PPO | Admitting: "Endocrinology

## 2023-06-03 ENCOUNTER — Ambulatory Visit (INDEPENDENT_AMBULATORY_CARE_PROVIDER_SITE_OTHER): Payer: BC Managed Care – PPO | Admitting: "Endocrinology

## 2023-06-03 ENCOUNTER — Encounter: Payer: Self-pay | Admitting: "Endocrinology

## 2023-06-03 DIAGNOSIS — Z8632 Personal history of gestational diabetes: Secondary | ICD-10-CM

## 2023-06-03 DIAGNOSIS — E039 Hypothyroidism, unspecified: Secondary | ICD-10-CM | POA: Diagnosis not present

## 2023-06-03 MED ORDER — PHENTERMINE HCL 15 MG PO CAPS: 15.0000 mg | ORAL_CAPSULE | Freq: Every morning | ORAL | 2 refills | Status: DC

## 2023-06-03 NOTE — Patient Instructions (Signed)
Pt interested in weight loss Discussed lifestyle changes, medical management as well as bariatric surgery Maintain healthy lifestyle including 1200 Cal/day, 30 min of activity/day, avoiding refined/processed/outside food 20 minutes physical activity per day, in continuum or interruptedly through the day  Goals: less than 60 grams of carbohydrate/meal, 1200-1500 Cal/day, 10,0000 steps a day and weight loss of 0.5-1 lb/ wk  Sleep 7-9 hours/day, adapt good sleep hygiene Adapt de-stressing and relaxation techniques to prevent stress induced weight gain Avoid/switch medications that lead to weight gain by discussing with the prescribing physician

## 2023-06-03 NOTE — Progress Notes (Signed)
Outpatient Endocrinology Note Kristie [place], MD    Kristie Santiago 33-Aug-1992 [id number]  Referring Provider: Maretta Bees, PA Primary Care Provider: Maretta Santiago, Santiago Reason for consultation: Subjective   Assessment & Plan  Diagnoses and all orders for this visit:  Morbid obesity (HCC) -     TSH; Future -     Lipid panel -     Hemoglobin A1c -     Insulin, random -     Comprehensive metabolic panel  Acquired hypothyroidism  History of gestational diabetes  Other orders -     phentermine 15 MG capsule; Take 1 capsule (15 mg total) by mouth in the morning.   Complain of slow weight gain over the course of years, and 15 to 20 pounds in the past year after patient had her child Patient reports following the lifestyle protocol prior to birth and did not benefit from any weight loss Currently on metformin 500 mg daily Interested in weight loss medications Discussed lifestyle changes, medical management as well as bariatric surgery Maintain healthy lifestyle including 1200 Cal/day, 30 min of activity/day, avoiding refined/processed/outside food 20 minutes physical activity per day, in continuum or interruptedly through the day  Goals: less than 60 grams of carbohydrate/meal, 1200-1500 Cal/day, 10,0000 steps a day and weight loss of 0.5-1 lb/ wk  Sleep 7-9 hours/day, adapt good sleep hygiene Adapt de-stressing and relaxation techniques to prevent stress induced weight gain Avoid/switch medications that lead to weight gain by discussing with the prescribing physician   Discussed various medications, patient is not interested in a shot at this point 06/03/23: Ordered phentermine 15 mg qam, discussed side effects, no contraindications Ordered baseline labs this morning  History of hypothyroidism since few years Currently taking levothyroxine 88 mcg with other medications Last thyroid labs WNL in 03/2023 Recommend to take levothyroxine first thing in the morning  on empty stomach and wait at least 30 minutes to 1 hour before eating or drinking anything or taking any other medications. Space out levothyroxine by 4 hours from any acid reflux medication, fibrate, iron, calcium, multivitamin, birth control pills and nutritional supplements.    Return in about 4 weeks (around 07/01/2023) for visit, labs today.   I have reviewed current medications, nurse's notes, allergies, vital signs, past medical and surgical history, family medical history, and social history for this encounter. Counseled patient on symptoms, examination findings, lab findings, imaging results, treatment decisions and monitoring and prognosis. The patient understood the recommendations and agrees with the treatment plan. All questions regarding treatment plan were fully answered.  Kristie [place], MD  06/03/23   History of Present Illness HPI  Kristie Santiago is a 33 y.o. year old female who presents for evaluation of weight gain.  C/o weight gain of 15-20 lbs in past year when she had a baby  Prior to that, patient had a slow weight gain over the course of years, no dramatic or sudden weight gain Patient reports having an active lifestyle  Has history of gestational diabetes, was able to get pregnant after 1 year on her own  Hypothyroidism was diagnosed a few years ago Currently takes levothyroxine 88 mcg p.o. daily, takes with other medications Patient currently has no plans for pregnancy but may plan it in the future  Physical Exam  BP 122/74   Pulse 91   Ht 5\' 4"  (1.626 m)   Wt 263 lb 3.2 oz (119.4 kg)   SpO2 98%   BMI 45.18 kg/m  Constitutional: well developed, well nourished Head: normocephalic, atraumatic Eyes: sclera anicteric, no redness Neck: supple Lungs: normal respiratory effort Neurology: alert and oriented Skin: dry, no appreciable rashes Musculoskeletal: no appreciable defects Psychiatric: normal mood and affect   Current Medications Patient's  Medications  New Prescriptions   PHENTERMINE 15 MG CAPSULE    Take 1 capsule (15 mg total) by mouth in the morning.  Previous Medications   CETIRIZINE (ZYRTEC) 10 MG TABLET    Take by mouth.   LEVOTHYROXINE (SYNTHROID) 88 MCG TABLET    Take by mouth.   METFORMIN (GLUCOPHAGE) 500 MG TABLET    Take 1 tablet (500 mg total) by mouth daily with breakfast.  Modified Medications   No medications on file  Discontinued Medications   No medications on file    Allergies Allergies  Allergen Reactions   Other     Allergic to cats    Past Medical History History reviewed. No pertinent past medical history.  Past Surgical History Past Surgical History:  Procedure Laterality Date   CESAREAN SECTION  2024   CHOLECYSTECTOMY  2018   WISDOM TOOTH EXTRACTION      Family History family history includes Alzheimer's disease in her maternal grandmother; Cancer in her paternal grandfather; Diabetes in her paternal grandfather and paternal grandmother; Hearing loss in her father; Heart disease in her father; Mental illness in her maternal grandmother.  Social History Social History   Socioeconomic History   Marital status: Married    Spouse name: Not on file   Number of children: 1   Years of education: Not on file   Highest education level: Some college, no degree  Occupational History   Not on file  Tobacco Use   Smoking status: Never   Smokeless tobacco: Never  Vaping Use   Vaping status: Never Used  Substance and Sexual Activity   Alcohol use: Yes   Drug use: No   Sexual activity: Yes    Partners: Male    Birth control/protection: None  Other Topics Concern   Not on file  Social History Narrative   Not on file   Social Drivers of Health   Financial Resource Strain: Not on file  Food Insecurity: Low Risk  (06/22/2022)   Received from [facility], [facility]   Food Insecurity    Within the past 12 months, did the food you bought just not last  and you didn't have money to get more?: No    Within the past 12 months, did you worry that your food would run out before you got money to buy more?: No  Transportation Needs: Low Risk  (06/22/2022)   Received from [facility], [facility]   Transportation Needs    Within the past 12 months, has a lack of transportation kept you from medical appointments or from doing things needed for daily living?: No  Physical Activity: Not on file  Stress: Not on file  Social Connections: Unknown (09/08/2021)   Received from [facility], [facility]   Social Network    Social Network: Not on file  Intimate Partner Violence: Unknown (08/07/2021)   Received from [facility], [facility]   HITS    Physically Hurt: Not on file    Insult or Talk Down To: Not on file    Threaten Physical Harm: Not on file    Scream or Curse: Not on file    No results found for: "CHOL" No results  found for: "HDL" No results found for: "LDLCALC" No results found for: "TRIG" No results found for: "CHOLHDL" Lab Results  Component Value Date   CREATININE 0.65 03/19/2023   Lab Results  Component Value Date   GFR 116.23 03/19/2023      Component Value Date/Time   NA 140 03/19/2023 0751   K 4.3 03/19/2023 0751   CL 104 03/19/2023 0751   CO2 29 03/19/2023 0751   GLUCOSE 87 03/19/2023 0751   BUN 13 03/19/2023 0751   CREATININE 0.65 03/19/2023 0751   CALCIUM 9.3 03/19/2023 0751   PROT 7.6 03/19/2023 0751   ALBUMIN 4.2 03/19/2023 0751   AST 16 03/19/2023 0751   ALT 19 03/19/2023 0751   ALKPHOS 97 03/19/2023 0751   BILITOT 0.4 03/19/2023 0751      Latest Ref Rng & Units 03/19/2023    7:51 AM  BMP  Glucose 70 - 99 mg/dL 87   BUN 6 - 23 mg/dL 13   Creatinine 6.57 - 1.20 mg/dL 8.46   Sodium 962 - 952 mEq/L 140   Potassium 3.5 - 5.1 mEq/L 4.3   Chloride 96 - 112 mEq/L 104   CO2 19 - 32 mEq/L 29   Calcium 8.4 - 10.5 mg/dL 9.3        Component Value Date/Time   WBC 9.4  03/19/2023 0751   RBC 5.16 (H) 03/19/2023 0751   HGB 14.5 03/19/2023 0751   HCT 43.5 03/19/2023 0751   PLT 407.0 (H) 03/19/2023 0751   MCV 84.3 03/19/2023 0751   MCHC 33.4 03/19/2023 0751   RDW 14.0 03/19/2023 0751   LYMPHSABS 2.2 03/19/2023 0751   MONOABS 0.6 03/19/2023 0751   EOSABS 0.2 03/19/2023 0751   BASOSABS 0.0 03/19/2023 0751   Lab Results  Component Value Date   TSH 2.87 03/19/2023   TSH 0.385 (L) 07/19/2021   TSH 5.440 (H) 05/17/2021   FREET4 0.98 03/19/2023         Parts of this note may have been dictated using voice recognition software. There may be variances in spelling and vocabulary which are unintentional. Not all errors are proofread. Please notify the [facility] Parkin if any discrepancies are noted or if the meaning of any statement is not clear.

## 2023-06-04 LAB — COMPREHENSIVE METABOLIC PANEL
AG Ratio: 1.1 (calc) (ref 1.0–2.5)
ALT: 15 U/L (ref 6–29)
AST: 14 U/L (ref 10–30)
Albumin: 4 g/dL (ref 3.6–5.1)
Alkaline phosphatase (APISO): 98 U/L (ref 31–125)
BUN: 18 mg/dL (ref 7–25)
CO2: 25 mmol/L (ref 20–32)
Calcium: 9.4 mg/dL (ref 8.6–10.2)
Chloride: 105 mmol/L (ref 98–110)
Creat: 0.58 mg/dL (ref 0.50–0.97)
Globulin: 3.7 g/dL (ref 1.9–3.7)
Glucose, Bld: 76 mg/dL (ref 65–99)
Potassium: 4.4 mmol/L (ref 3.5–5.3)
Sodium: 138 mmol/L (ref 135–146)
Total Bilirubin: 0.2 mg/dL (ref 0.2–1.2)
Total Protein: 7.7 g/dL (ref 6.1–8.1)

## 2023-06-04 LAB — LIPID PANEL
Cholesterol: 164 mg/dL (ref <200)
HDL: 42 mg/dL — ABNORMAL LOW (ref >=50)
LDL Cholesterol (Calc): 101 mg/dL — ABNORMAL HIGH
Non-HDL Cholesterol (Calc): 122 mg/dL (ref <130)
Total CHOL/HDL Ratio: 3.9 (calc) (ref <5.0)
Triglycerides: 114 mg/dL (ref <150)

## 2023-06-04 LAB — INSULIN, RANDOM: Insulin: 17.1 u[IU]/mL

## 2023-06-04 LAB — HEMOGLOBIN A1C
Hgb A1c MFr Bld: 5.5 %{Hb} (ref <5.7)
Mean Plasma Glucose: 111 mg/dL
eAG (mmol/L): 6.2 mmol/L

## 2023-06-07 DIAGNOSIS — F4323 Adjustment disorder with mixed anxiety and depressed mood: Secondary | ICD-10-CM | POA: Diagnosis not present

## 2023-06-07 DIAGNOSIS — Z634 Disappearance and death of family member: Secondary | ICD-10-CM | POA: Diagnosis not present

## 2023-06-28 DIAGNOSIS — Z713 Dietary counseling and surveillance: Secondary | ICD-10-CM | POA: Diagnosis not present

## 2023-07-01 ENCOUNTER — Ambulatory Visit: Payer: BC Managed Care – PPO | Admitting: "Endocrinology

## 2023-07-05 DIAGNOSIS — Z634 Disappearance and death of family member: Secondary | ICD-10-CM | POA: Diagnosis not present

## 2023-07-05 DIAGNOSIS — F4323 Adjustment disorder with mixed anxiety and depressed mood: Secondary | ICD-10-CM | POA: Diagnosis not present

## 2023-07-11 ENCOUNTER — Other Ambulatory Visit: Payer: Self-pay

## 2023-07-11 DIAGNOSIS — R635 Abnormal weight gain: Secondary | ICD-10-CM

## 2023-07-11 DIAGNOSIS — E88819 Insulin resistance, unspecified: Secondary | ICD-10-CM

## 2023-07-11 DIAGNOSIS — Z6841 Body Mass Index (BMI) 40.0 and over, adult: Secondary | ICD-10-CM

## 2023-07-11 DIAGNOSIS — L83 Acanthosis nigricans: Secondary | ICD-10-CM

## 2023-07-11 MED ORDER — METFORMIN HCL 500 MG PO TABS: 500.0000 mg | ORAL_TABLET | Freq: Every day | ORAL | 0 refills | Status: DC

## 2023-07-15 DIAGNOSIS — F4323 Adjustment disorder with mixed anxiety and depressed mood: Secondary | ICD-10-CM | POA: Diagnosis not present

## 2023-07-15 DIAGNOSIS — Z634 Disappearance and death of family member: Secondary | ICD-10-CM | POA: Diagnosis not present

## 2023-07-26 DIAGNOSIS — K922 Gastrointestinal hemorrhage, unspecified: Secondary | ICD-10-CM | POA: Diagnosis not present

## 2023-07-29 DIAGNOSIS — Z634 Disappearance and death of family member: Secondary | ICD-10-CM | POA: Diagnosis not present

## 2023-07-29 DIAGNOSIS — F4323 Adjustment disorder with mixed anxiety and depressed mood: Secondary | ICD-10-CM | POA: Diagnosis not present

## 2023-08-08 NOTE — Progress Notes (Unsigned)
    Aleen Sells D.[facility] [address] [place] [address] Phone: [phone]   Assessment and Plan:     There are no diagnoses linked to this encounter.  ***   Pertinent previous records reviewed include ***    Follow Up: ***     Subjective:   I,  , am serving as a Neurosurgeon for Doctor Richardean Sale  Chief Complaint: ankle pain   HPI:   08/09/2023 Patient is a 33 year old female with ankle pain. Patient states   Relevant Historical Information: ***  Additional pertinent review of systems negative.   Current Outpatient Medications:    cetirizine (ZYRTEC) 10 MG tablet, Take by mouth., Disp: , Rfl:    levothyroxine (SYNTHROID) 88 MCG tablet, Take by mouth., Disp: , Rfl:    metFORMIN (GLUCOPHAGE) 500 MG tablet, Take 1 tablet (500 mg total) by mouth daily with breakfast., Disp: 90 tablet, Rfl: 0   phentermine 15 MG capsule, Take 1 capsule (15 mg total) by mouth in the morning., Disp: 30 capsule, Rfl: 2   Objective:     There were no vitals filed for this visit.    There is no height or weight on file to calculate BMI.    Physical Exam:    ***   Electronically signed by:  Aleen Sells D.[facility] 7:38 AM 08/08/23

## 2023-08-09 ENCOUNTER — Ambulatory Visit (INDEPENDENT_AMBULATORY_CARE_PROVIDER_SITE_OTHER)

## 2023-08-09 ENCOUNTER — Ambulatory Visit: Admitting: Sports Medicine

## 2023-08-09 VITALS — BP 120/70 | HR 97 | Ht 64.0 in | Wt 259.8 lb

## 2023-08-09 DIAGNOSIS — M25571 Pain in right ankle and joints of right foot: Secondary | ICD-10-CM

## 2023-08-09 DIAGNOSIS — Z634 Disappearance and death of family member: Secondary | ICD-10-CM | POA: Diagnosis not present

## 2023-08-09 DIAGNOSIS — S93401A Sprain of unspecified ligament of right ankle, initial encounter: Secondary | ICD-10-CM

## 2023-08-09 DIAGNOSIS — M7731 Calcaneal spur, right foot: Secondary | ICD-10-CM | POA: Diagnosis not present

## 2023-08-09 DIAGNOSIS — M79671 Pain in right foot: Secondary | ICD-10-CM | POA: Diagnosis not present

## 2023-08-09 DIAGNOSIS — F4323 Adjustment disorder with mixed anxiety and depressed mood: Secondary | ICD-10-CM | POA: Diagnosis not present

## 2023-08-09 MED ORDER — MELOXICAM 15 MG PO TABS: 15.0000 mg | ORAL_TABLET | Freq: Every day | ORAL | 0 refills | Status: DC

## 2023-08-09 NOTE — Patient Instructions (Addendum)
-   Start meloxicam 15 mg daily x2 weeks.  If still having pain after 2 weeks, complete 3rd-week of NSAID. May use remaining NSAID as needed once daily for pain control.  Do not to use additional over-the-counter NSAIDs (ibuprofen, naproxen, Advil, Aleve, etc.) while taking prescription NSAIDs.  May use Tylenol [phone] mg 2 to 3 times a day for breakthrough pain.  Foot and Ankle HEP. Referred to PT. Recommend to be pain free with walking. In case there is pain when walking in tennis shoes then you should use your boot.  Follow up in 3 to 4 weeks.

## 2023-08-12 ENCOUNTER — Telehealth: Payer: Self-pay | Admitting: Sports Medicine

## 2023-08-12 ENCOUNTER — Other Ambulatory Visit: Payer: Self-pay | Admitting: Sports Medicine

## 2023-08-12 DIAGNOSIS — S93401A Sprain of unspecified ligament of right ankle, initial encounter: Secondary | ICD-10-CM

## 2023-08-12 DIAGNOSIS — M25571 Pain in right ankle and joints of right foot: Secondary | ICD-10-CM

## 2023-08-12 NOTE — Telephone Encounter (Signed)
 Patient called to ask if she could be referred to the [facility] location instead of coming here for her PT. I have cancelled her appointment for this Friday with [facility].

## 2023-08-12 NOTE — Telephone Encounter (Signed)
 New referral placed.

## 2023-08-12 NOTE — Progress Notes (Signed)
 New referral sent.

## 2023-08-16 ENCOUNTER — Ambulatory Visit: Admitting: Physical Therapy

## 2023-08-19 ENCOUNTER — Encounter: Payer: Self-pay | Admitting: Sports Medicine

## 2023-08-19 DIAGNOSIS — Z634 Disappearance and death of family member: Secondary | ICD-10-CM | POA: Diagnosis not present

## 2023-08-19 DIAGNOSIS — F4323 Adjustment disorder with mixed anxiety and depressed mood: Secondary | ICD-10-CM | POA: Diagnosis not present

## 2023-08-27 ENCOUNTER — Ambulatory Visit: Attending: Sports Medicine | Admitting: Physical Therapy

## 2023-08-27 DIAGNOSIS — M25571 Pain in right ankle and joints of right foot: Secondary | ICD-10-CM | POA: Insufficient documentation

## 2023-08-27 DIAGNOSIS — S93401A Sprain of unspecified ligament of right ankle, initial encounter: Secondary | ICD-10-CM | POA: Diagnosis not present

## 2023-08-27 NOTE — Therapy (Signed)
 OUTPATIENT PHYSICAL THERAPY LOWER EXTREMITY EVALUATION   Patient Name: Kristie Santiago MRN: [id number] DOB:1990/07/03, 33 y.o., female Today's Date: 08/27/2023  END OF SESSION:  PT End of Session - 08/27/23 1404     Visit Number 1    Number of Visits 20    Date for PT Re-Evaluation 11/05/23    Authorization Type [employer] [id number]    Authorization - Visit Number 1    Authorization - Number of Visits 20    Progress Note Due on Visit 10    PT Start Time 1300    PT Stop Time 1345    PT Time Calculation (min) 45 min    Activity Tolerance Patient tolerated treatment well    Behavior During Therapy [facility] for tasks assessed/performed             No past medical history on file. Past Surgical History:  Procedure Laterality Date   CESAREAN SECTION  2024   CHOLECYSTECTOMY  2018   WISDOM TOOTH EXTRACTION     Patient Active Problem List   Diagnosis Date Noted   Insulin  resistance 04/11/2023   Thrombocytosis 04/11/2023   Family history of cardiovascular disease 04/11/2023   Chicken pox 03/14/2023   Acanthosis nigricans 03/14/2023   Abnormal weight gain 03/14/2023   Hirsutism 03/14/2023   Striae purple 03/14/2023   Telangiectasias 03/14/2023   BMI 40.0-44.9, adult (HCC) 03/14/2023   Fatigue 03/14/2023   Sprain of left ankle 03/14/2023   Hypothyroidism 05/23/2021    PCP: Dr. Corita Diego   REFERRING PROVIDER: Dr. Ulysees Gander   REFERRING DIAG:  [phone] (ICD-10-CM) - Acute right ankle painS93.401A (ICD-10-CM) - Inversion sprain of ankle, right, initial encounter  THERAPY DIAG:  Pain in right ankle and joints of right foot  Rationale for Evaluation and Treatment: Rehabilitation  ONSET DATE: February 2025    SUBJECTIVE:   SUBJECTIVE STATEMENT: See pertinent history   PERTINENT HISTORY: Pt reports sustaining a fall while walking around at a [employer] game where she fell on both knees after twisting her right ankle. She has since been feeling a dull ache in right  ankle especially with right ankle inversion. This pain has not stopped her from doing anything it just serves as a constant annoyance.  PAIN:  Are you having pain? Yes: NPRS scale: 2-3/10 and worst 8/10  Pain location: Lateral side of right ankle  Pain description: Normally dull but sometimes Sharp  Aggravating factors: Inverting ankle and when pressure is place on ankle Relieving factors: Ice and propping it up   PRECAUTIONS: None  RED FLAGS: None   WEIGHT BEARING RESTRICTIONS: No  FALLS:  Has patient fallen in last 6 months? No  LIVING ENVIRONMENT: Lives with: lives with their family Lives in: House/apartment Stairs:  Elevator to the floor they live on  Has following equipment at home: None  OCCUPATION: Desk job   PLOF: Independent  PATIENT GOALS: To experience less right ankle pain in her day to day.   NEXT MD VISIT: Nothing scheduled at the moment  OBJECTIVE:  Note: Objective measures were completed at Evaluation unless otherwise noted.  VITALS: BP 121/70 HR 91 SpO2 100%   DIAGNOSTIC FINDINGS:   CLINICAL DATA:  Right foot and ankle pain.  Twisting injury.   EXAM: RIGHT ANKLE - COMPLETE 3+ VIEW; RIGHT FOOT COMPLETE - 3+ VIEW   COMPARISON:  None Available.   FINDINGS: Ankle: There is no evidence of fracture, dislocation, or joint effusion. The ankle mortise is preserved. Small plantar calcaneal spur. There  is no evidence of arthropathy or other focal bone abnormality. Soft tissues are unremarkable.   Foot: No acute fracture or dislocation. Alignment is normal. Trace dorsal spurring in the midfoot. No erosions. Unremarkable soft tissues.   IMPRESSION: 1. No acute fracture or dislocation of the right ankle or foot. 2. Small plantar calcaneal spur.     Electronically Signed   By: Chadwick Colonel M.D.   On: 08/16/2023 21:17    PATIENT SURVEYS:  LEFS 79% and 63/80  COGNITION: Overall cognitive status: Within functional limits for tasks  assessed     SENSATION: WFL   MUSCLE LENGTH: Hamstrings: Not tested  Andy Bannister test:  Not tested   POSTURE: No Significant postural limitations  PALPATION: Left lateral ankle tendons on right ankle TTP   LOWER EXTREMITY ROM:  Active ROM Right eval Left eval  Hip flexion    Hip extension    Hip abduction    Hip adduction    Hip internal rotation    Hip external rotation    Knee flexion    Knee extension    Ankle dorsiflexion 15 20  Ankle plantarflexion 50 50  Ankle inversion 35* 35  Ankle eversion 15 15   (Blank rows = not tested)        LOWER EXTREMITY MMT:  MMT Right eval Left eval  Hip flexion    Hip extension    Hip abduction    Hip adduction    Hip internal rotation    Hip external rotation    Knee flexion    Knee extension    Ankle dorsiflexion 4+ 4+  Ankle plantarflexion (based on standing heel raises)  4 (3 reps) 5 (5 reps)  Ankle inversion 4+ 4+  Ankle eversion 4+ 4+   (Blank rows = not tested)  Standing Heel Raises R/L 4/5     FUNCTIONAL TESTS:  Squat: NT  Step Overs: NT  Single Leg Test: NT   GAIT: Distance walked: 50 ft; wearing flip flops  Assistive device utilized: None Level of assistance: Complete Independence Comments: No gait deficits noted                                                                                                                                 TREATMENT DATE:   08/27/23: All exercises performed on RLE  Seated Calf Stretch 2 x 30 sec  Eccentric Heel Raise with BUE support 2 x 10   PATIENT EDUCATION:  Education details: Form and technique for correct performance of exercise   Person educated: Patient Education method: Explanation, Demonstration, Verbal cues, and Handouts Education comprehension: verbalized understanding, returned demonstration, verbal cues required, and tactile cues required  HOME EXERCISE PROGRAM: Access Code: [email or web address] URL: [email or web address] Date:  08/27/2023 Prepared by: Marge Shed  Exercises - Standing Eccentric Heel Raise  - 3-4 x weekly - 3 sets - 10 reps - Long Sitting Calf Stretch with Strap  - 1 x daily -  7 x weekly - 3 reps - 60 sec sec  hold  ASSESSMENT:  CLINICAL IMPRESSION: Patient is a 33 y.o. white female who was seen today for physical therapy evaluation and treatment for right ankle pain in the setting of right lateral ankle sprain. She is now nearly 8 weeks s/p right lateral ankle sprain with no visible swelling or ecchymosis, and she has increased lateral ankle pain along the ATFL and CFL. Her deficits include increase right ankle pain along with decreased right ankle ROM and strength. She will benefit from skilled PT to address these aforementioned deficits to return to walking, bending, carrying, and lifting without discomfort and to prevent future ankle sprains.   OBJECTIVE IMPAIRMENTS: decreased ROM, decreased strength, impaired flexibility, obesity, and pain.   ACTIVITY LIMITATIONS: carrying, lifting, bending, standing, squatting, stairs, and locomotion level  PARTICIPATION LIMITATIONS: shopping, community activity, and yard work  PERSONAL FACTORS: 1-2 comorbidities: Obesity and T2DM  are also affecting patient's functional outcome.   REHAB POTENTIAL: Good  CLINICAL DECISION MAKING: Stable/uncomplicated  EVALUATION COMPLEXITY: Low   GOALS: Goals reviewed with patient? No  SHORT TERM GOALS: Target date: 09/10/2023 Patient will demonstrate undestanding of home exercise plan by performing exercises correctly with evidence of good carry over with min to no verbal or tactile cues .   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 Fair[place] et al 1999) Baseline: 63/80  Goal status:  INITIAL  2.  Patient will improve her right ankle strength by 1 grade MMT for improved right ankle stability and function to avoid future sprains.  Baseline: Ankle PF R/L 4/5   Goal status: INITIAL  3. Patient will improve right ankle mobility/AROM to be nearly symmetrical with left ankle (<=10%) to improve right ankle function and to decrease risk of re-injuring right ankle.  Baseline: Ankle DF R/L 15/20  Goal status: INITIAL    PLAN:  PT FREQUENCY: 1-2x/week  PT DURATION: 10 weeks  PLANNED INTERVENTIONS: 97164- PT Re-evaluation, 97750- Physical Performance Testing, 97110-Therapeutic exercises, 97530- Therapeutic activity, [id number]- Neuromuscular re-education, 97535- Self Care, 16109- Manual therapy, [id number]- Gait training, [phone]- Orthotic Initial, [phone]- Orthotic/Prosthetic subsequent, [phone]- Aquatic Therapy, [phone]- Splinting, Z3086- Electrical stimulation (unattended), [phone]- Electrical stimulation (manual), [id number]- Ultrasound, [id number]- Traction (mechanical), Patient/Family education, Balance training, Stair training, Taping, Dry Needling, Joint mobilization, Joint manipulation, Spinal manipulation, Spinal mobilization, DME instructions, Cryotherapy, and Moist heat  PLAN FOR NEXT SESSION:  Measure hip MMT and PROM of ankle DF. Observe gait mechanics. Squat and step over and single leg stance. Progress right ankle ROM and strength: single leg heel raises and  standing calf stretch on step. Single leg stance in corner   Marge Shed PT, DPT  [facility] [address], [place], [address] Phone: [phone]   Fax:  [phone]

## 2023-08-28 ENCOUNTER — Ambulatory Visit: Admitting: "Endocrinology

## 2023-08-29 ENCOUNTER — Ambulatory Visit: Admitting: Physical Therapy

## 2023-08-30 ENCOUNTER — Ambulatory Visit: Admitting: Sports Medicine

## 2023-09-01 ENCOUNTER — Other Ambulatory Visit: Payer: Self-pay | Admitting: "Endocrinology

## 2023-09-03 DIAGNOSIS — L308 Other specified dermatitis: Secondary | ICD-10-CM | POA: Diagnosis not present

## 2023-09-03 DIAGNOSIS — B028 Zoster with other complications: Secondary | ICD-10-CM | POA: Diagnosis not present

## 2023-09-10 ENCOUNTER — Encounter: Payer: Self-pay | Admitting: "Endocrinology

## 2023-09-10 ENCOUNTER — Telehealth (INDEPENDENT_AMBULATORY_CARE_PROVIDER_SITE_OTHER): Admitting: "Endocrinology

## 2023-09-10 DIAGNOSIS — Z6841 Body Mass Index (BMI) 40.0 and over, adult: Secondary | ICD-10-CM

## 2023-09-10 DIAGNOSIS — Z8632 Personal history of gestational diabetes: Secondary | ICD-10-CM

## 2023-09-10 DIAGNOSIS — E039 Hypothyroidism, unspecified: Secondary | ICD-10-CM

## 2023-09-10 MED ORDER — PHENTERMINE HCL 30 MG PO CAPS: 30.0000 mg | ORAL_CAPSULE | ORAL | 0 refills | Status: DC

## 2023-09-10 NOTE — Progress Notes (Signed)
 The patient reports they are currently: [place]. I spent 6-7 minutes on the video with the patient on the date of service. I spent an additional 5 minutes on pre- and post-visit activities on the date of service.   The patient was physically located in [place]  or a state in which I am permitted to provide care. The patient and/or parent/guardian understood that s/he may incur co-pays and cost sharing, and agreed to the telemedicine visit. The visit was reasonable and appropriate under the circumstances given the patient's presentation at the time.  The patient and/or parent/guardian has been advised of the potential risks and limitations of this mode of treatment (including, but not limited to, the absence of in-person examination) and has agreed to be treated using telemedicine. The patient's/patient's family's questions regarding telemedicine have been answered.   The patient and/or parent/guardian has also been advised to contact their provider's office for worsening conditions, and seek emergency medical treatment and/or call 911 if the patient deems either necessary.      Outpatient Endocrinology Note Kristie Newcomer, MD    Kristie Santiago 1990/12/25 [id number]  Referring Provider: Mandy Second, PA Primary Care Provider: Mandy Second, [place] Reason for consultation: Subjective   Assessment & Plan  Diagnoses and all orders for this visit:  Morbid obesity (HCC)  Acquired hypothyroidism -     TSH + free T4  History of gestational diabetes  Other orders -     phentermine  30 MG capsule; Take 1 capsule (30 mg total) by mouth every morning.   Complain of slow weight gain over the course of years, and 15 to 20 pounds in the past year after patient had her child Patient reports following the lifestyle protocol prior to birth and did not benefit from any weight loss Currently on metformin  500 mg daily Interested in weight loss medications Discussed lifestyle changes,  medical management as well as bariatric surgery Discussed various medications, patient is not interested in a shot at this point 06/03/23: Ordered phentermine  15 mg qam, discussed side effects, no contraindications", weight 263 lbs, baseline labs WNL except high LDL 09/10/23: start phentermine  30 gm every day since no S/E and weight loss of 10 lbs (current weight 253 lbs), maintain weekly weight log Maintain healthy lifestyle including 1200 Cal/day, 30 min of activity/day, avoiding refined/processed/outside food 20 minutes physical activity per day, in continuum or interruptedly through the day  Goals: less than 60 grams of carbohydrate/meal, 1200-1500 Cal/day, 10,0000 steps a day and weight loss of 0.5-1 lb/ wk  Sleep 7-9 hours/day, adapt good sleep hygiene Adapt de-stressing and relaxation techniques to prevent stress induced weight gain Avoid/switch medications that lead to weight gain by discussing with the prescribing physician   History of hypothyroidism since few years Currently taking levothyroxine  88 mcg, originally taking with other medications Last thyroid  labs WNL in 03/2023 Recommend to take levothyroxine  first thing in the morning on empty stomach and wait at least 30 minutes to 1 hour before eating or drinking anything or taking any other medications. Space out levothyroxine  by 4 hours from any acid reflux medication, fibrate, iron, calcium, multivitamin, birth control pills and nutritional supplements.    Return in about 3 months (around 12/11/2023) for visit + labs before next visit.   I have reviewed current medications, nurse's notes, allergies, vital signs, past medical and surgical history, family medical history, and social history for this encounter. Counseled patient on symptoms, examination findings, lab findings, imaging results, treatment decisions and monitoring and prognosis.  The patient understood the recommendations and agrees with the treatment plan. All questions  regarding treatment plan were fully answered.  Kristie Newcomer, MD  09/10/23   History of Present Illness HPI  Kristie Santiago is a 33 y.o. year old female who presents for follow-up of weight gain and hypothyroidism.  Patient took phentermine  15 mg every morning without any side effects, weight was 263 lbs at that time, current weight today is 253 pounds Patient ran out of phentermine   Patient not calorie counting at this time Reports no new changes Taking levothyroxine  88 mcg po every day   Initial history: C/o weight gain of 15-20 lbs in past year when she had a baby  Prior to that, patient had a slow weight gain over the course of years, no dramatic or sudden weight gain Patient reports having an active lifestyle  Has history of gestational diabetes, was able to get pregnant after 1 year on her own  Hypothyroidism was diagnosed a few years ago Currently takes levothyroxine  88 mcg p.o. daily, takes with other medications Patient currently has no plans for pregnancy but may plan it in the future  Physical Exam  Ht 5' 4.8" (1.646 m)   Wt 253 lb (114.8 kg)   BMI 42.36 kg/m    Constitutional: well developed, well nourished Head: normocephalic, atraumatic Eyes: sclera anicteric, no redness Neck: supple Lungs: normal respiratory effort Neurology: alert and oriented Skin: dry, no appreciable rashes Musculoskeletal: no appreciable defects Psychiatric: normal mood and affect   Current Medications Patient's Medications  New Prescriptions   PHENTERMINE  30 MG CAPSULE    Take 1 capsule (30 mg total) by mouth every morning.  Previous Medications   CETIRIZINE (ZYRTEC) 10 MG TABLET    Take by mouth.   LEVOTHYROXINE  (SYNTHROID ) 88 MCG TABLET    Take by mouth.   MELOXICAM  (MOBIC ) 15 MG TABLET    Take 1 tablet (15 mg total) by mouth daily.   METFORMIN  (GLUCOPHAGE ) 500 MG TABLET    Take 1 tablet (500 mg total) by mouth daily with breakfast.  Modified Medications   No medications on  file  Discontinued Medications   PHENTERMINE  15 MG CAPSULE    Take 1 capsule (15 mg total) by mouth in the morning.    Allergies Allergies  Allergen Reactions   Other     Allergic to cats    Past Medical History No past medical history on file.  Past Surgical History Past Surgical History:  Procedure Laterality Date   CESAREAN SECTION  2024   CHOLECYSTECTOMY  2018   WISDOM TOOTH EXTRACTION      Family History family history includes Alzheimer's disease in her maternal grandmother; Cancer in her paternal grandfather; Diabetes in her paternal grandfather and paternal grandmother; Hearing loss in her father; Heart disease in her father; Mental illness in her maternal grandmother.  Social History Social History   Socioeconomic History   Marital status: Married    Spouse name: Not on file   Number of children: 1   Years of education: Not on file   Highest education level: Some college, no degree  Occupational History   Not on file  Tobacco Use   Smoking status: Never   Smokeless tobacco: Never  Vaping Use   Vaping status: Never Used  Substance and Sexual Activity   Alcohol use: Yes   Drug use: No   Sexual activity: Yes    Partners: Male    Birth control/protection: None  Other Topics Concern  Not on file  Social History Narrative   Not on file   Social Drivers of Health   Financial Resource Strain: Not on file  Food Insecurity: Low Risk  (06/22/2022)   Received [facility], [facility]   Food Insecurity    Within the past 12 months, did the food you bought just not last and you didn't have money to get more?: No    Within the past 12 months, did you worry that your food would run out before you got money to buy more?: No  Transportation Needs: Low Risk  (06/22/2022)   [facility], [facility]   Transportation Needs    Within the past 12 months, has a lack of transportation kept you  from medical appointments or from doing things needed for daily living?: No  Physical Activity: Not on file  Stress: Not on file  Social Connections: Unknown (09/08/2021)   Received from [facility], [facility]   Social Network    Social Network: Not on file  Intimate Partner Violence: Unknown (08/07/2021)   Received from [facility], [facility]   HITS    Physically Hurt: Not on file    Insult or Talk Down To: Not on file    Threaten Physical Harm: Not on file    Scream or Curse: Not on file    Lab Results  Component Value Date   CHOL 164 06/03/2023   Lab Results  Component Value Date   HDL 42 (L) 06/03/2023   Lab Results  Component Value Date   LDLCALC 101 (H) 06/03/2023   Lab Results  Component Value Date   TRIG 114 06/03/2023   Lab Results  Component Value Date   CHOLHDL 3.9 06/03/2023   Lab Results  Component Value Date   CREATININE 0.58 06/03/2023   Lab Results  Component Value Date   GFR 116.23 03/19/2023      Component Value Date/Time   NA 138 06/03/2023 0854   K 4.4 06/03/2023 0854   CL 105 06/03/2023 0854   CO2 25 06/03/2023 0854   GLUCOSE 76 06/03/2023 0854   BUN 18 06/03/2023 0854   CREATININE 0.58 06/03/2023 0854   CALCIUM 9.4 06/03/2023 0854   PROT 7.7 06/03/2023 0854   ALBUMIN 4.2 03/19/2023 0751   AST 14 06/03/2023 0854   ALT 15 06/03/2023 0854   ALKPHOS 97 03/19/2023 0751   BILITOT 0.2 06/03/2023 0854      Latest Ref Rng & Units 06/03/2023    8:54 AM 03/19/2023    7:51 AM  BMP  Glucose 65 - 99 mg/dL 76  87   BUN 7 - 25 mg/dL 18  13   Creatinine 8.65 - 0.97 mg/dL 7.84  6.96   BUN/Creat Ratio 6 - 22 (calc) SEE NOTE:    Sodium 135 - 146 mmol/L 138  140   Potassium 3.5 - 5.3 mmol/L 4.4  4.3   Chloride 98 - 110 mmol/L 105  104   CO2 20 - 32 mmol/L 25  29   Calcium 8.6 - 10.2 mg/dL 9.4  9.3        Component Value Date/Time   WBC 9.4 03/19/2023 0751   RBC 5.16 (H) 03/19/2023 0751   HGB 14.5 03/19/2023 0751   HCT 43.5  03/19/2023 0751   PLT 407.0 (H) 03/19/2023 0751   MCV 84.3 03/19/2023 0751   MCHC 33.4 03/19/2023 0751   RDW 14.0 03/19/2023  0751   LYMPHSABS 2.2 03/19/2023 0751   MONOABS 0.6 03/19/2023 0751   EOSABS 0.2 03/19/2023 0751   BASOSABS 0.0 03/19/2023 0751   Lab Results  Component Value Date   TSH 2.87 03/19/2023   TSH 0.385 (L) 07/19/2021   TSH 5.440 (H) 05/17/2021   FREET4 0.98 03/19/2023         Parts of this note may have been dictated using voice recognition software. There may be variances in spelling and vocabulary which are unintentional. Not all errors are proofread. Please notify the [facility] if any discrepancies are noted or if the meaning of any statement is not clear.

## 2023-09-17 ENCOUNTER — Ambulatory Visit: Payer: Worker's Compensation | Admitting: Physical Therapy

## 2023-09-18 NOTE — Therapy (Incomplete)
 OUTPATIENT PHYSICAL THERAPY LOWER EXTREMITY TREATMENT   Patient Name: Kristie Santiago MRN: [id number] DOB:[birth year], 33 y.o., female Today's Date: 09/18/2023  END OF SESSION:    No past medical history on file. Past Surgical History:  Procedure Laterality Date   CESAREAN SECTION  2024   CHOLECYSTECTOMY  2018   WISDOM TOOTH EXTRACTION     Patient Active Problem List   Diagnosis Date Noted   Insulin  resistance 04/11/2023   Thrombocytosis 04/11/2023   Family history of cardiovascular disease 04/11/2023   Chicken pox 03/14/2023   Acanthosis nigricans 03/14/2023   Abnormal weight gain 03/14/2023   Hirsutism 03/14/2023   Striae purple 03/14/2023   Telangiectasias 03/14/2023   BMI 40.0-44.9, adult (HCC) 03/14/2023   Fatigue 03/14/2023   Sprain of left ankle 03/14/2023   Hypothyroidism 05/23/2021    PCP: Dr. Corita Diego   REFERRING PROVIDER: Dr. Ulysees Gander   REFERRING DIAG:  [phone] (ICD-10-CM) - Acute right ankle painS93.401A (ICD-10-CM) - Inversion sprain of ankle, right, initial encounter  THERAPY DIAG:  Pain in right ankle and joints of right foot  Rationale for Evaluation and Treatment: Rehabilitation  ONSET DATE: February 2025    SUBJECTIVE:   SUBJECTIVE STATEMENT: ***  See pertinent history   PERTINENT HISTORY: Pt reports sustaining a fall while walking around at a [employer] game where she fell on both knees after twisting her right ankle. She has since been feeling a dull ache in right ankle especially with right ankle inversion. This pain has not stopped her from doing anything it just serves as a constant annoyance.  PAIN:  Are you having pain? Yes: NPRS scale: 2-3/10 and worst 8/10  Pain location: Lateral side of right ankle  Pain description: Normally dull but sometimes Sharp  Aggravating factors: Inverting ankle and when pressure is place on ankle Relieving factors: Ice and propping it up   PRECAUTIONS: None  RED  FLAGS: None   WEIGHT BEARING RESTRICTIONS: No  FALLS:  Has patient fallen in last 6 months? No  LIVING ENVIRONMENT: Lives with: lives with their family Lives in: House/apartment Stairs: Elevator to the floor they live on  Has following equipment at home: None  OCCUPATION: Desk job   PLOF: Independent  PATIENT GOALS: To experience less right ankle pain in her day to day.   NEXT MD VISIT: Nothing scheduled at the moment  OBJECTIVE:  Note: Objective measures were completed at Evaluation unless otherwise noted.  VITALS: BP 121/70 HR 91 SpO2 100%   DIAGNOSTIC FINDINGS:   CLINICAL DATA:  Right foot and ankle pain.  Twisting injury.   EXAM: RIGHT ANKLE - COMPLETE 3+ VIEW; RIGHT FOOT COMPLETE - 3+ VIEW   COMPARISON:  None Available.   FINDINGS: Ankle: There is no evidence of fracture, dislocation, or joint effusion. The ankle mortise is preserved. Small plantar calcaneal spur. There is no evidence of arthropathy or other focal bone abnormality. Soft tissues are unremarkable.   Foot: No acute fracture or dislocation. Alignment is normal. Trace dorsal spurring in the midfoot. No erosions. Unremarkable soft tissues.   IMPRESSION: 1. No acute fracture or dislocation of the right ankle or foot. 2. Small plantar calcaneal spur.     Electronically Signed   By: Chadwick Colonel M.D.   On: 08/16/2023 21:17    PATIENT SURVEYS:  LEFS 79% and 63/80  COGNITION: Overall cognitive status: Within functional limits for tasks assessed     SENSATION: WFL   MUSCLE LENGTH: Hamstrings: Not tested  Andy Bannister test:  Not  tested   POSTURE: No Significant postural limitations  PALPATION: Left lateral ankle tendons on right ankle TTP   LOWER EXTREMITY ROM:  Active ROM Right eval Left eval  Hip flexion    Hip extension    Hip abduction    Hip adduction    Hip internal rotation    Hip external rotation    Knee flexion    Knee extension    Ankle dorsiflexion 15 20   Ankle plantarflexion 50 50  Ankle inversion 35* 35  Ankle eversion 15 15   (Blank rows = not tested)        LOWER EXTREMITY MMT:  MMT Right eval Left eval  Hip flexion    Hip extension    Hip abduction    Hip adduction    Hip internal rotation    Hip external rotation    Knee flexion    Knee extension    Ankle dorsiflexion 4+ 4+  Ankle plantarflexion (based on standing heel raises)  4 (3 reps) 5 (5 reps)  Ankle inversion 4+ 4+  Ankle eversion 4+ 4+   (Blank rows = not tested)  Standing Heel Raises R/L 4/5     FUNCTIONAL TESTS:  Squat: NT  Step Overs: NT  Single Leg Test: NT   GAIT: Distance walked: 50 ft; wearing flip flops  Assistive device utilized: None Level of assistance: Complete Independence Comments: No gait deficits noted                                                                                                                                 TREATMENT DATE:  09/19/23: ***   08/27/23: All exercises performed on RLE  Seated Calf Stretch 2 x 30 sec  Eccentric Heel Raise with BUE support 2 x 10   PATIENT EDUCATION:  Education details: Form and technique for correct performance of exercise   Person educated: Patient Education method: Explanation, Demonstration, Verbal cues, and Handouts Education comprehension: verbalized understanding, returned demonstration, verbal cues required, and tactile cues required  HOME EXERCISE PROGRAM: Access Code: [email or web address] URL: [email or web address] Date: 08/27/2023 Prepared by: Marge Shed  Exercises - Standing Eccentric Heel Raise  - 3-4 x weekly - 3 sets - 10 reps - Long Sitting Calf Stretch with Strap  - 1 x daily - 7 x weekly - 3 reps - 60 sec sec  hold  ASSESSMENT:  CLINICAL IMPRESSION: ***  Patient is a 33 y.o. white female who was seen today for physical therapy evaluation and treatment for right ankle pain in the setting of right lateral ankle sprain. She is now nearly 8 weeks  s/p right lateral ankle sprain with no visible swelling or ecchymosis, and she has increased lateral ankle pain along the ATFL and CFL. Her deficits include increase right ankle pain along with decreased right ankle ROM and strength. She will benefit from skilled PT to address these aforementioned deficits to return to walking, bending, carrying,  and lifting without discomfort and to prevent future ankle sprains.   OBJECTIVE IMPAIRMENTS: decreased ROM, decreased strength, impaired flexibility, obesity, and pain.   ACTIVITY LIMITATIONS: carrying, lifting, bending, standing, squatting, stairs, and locomotion level  PARTICIPATION LIMITATIONS: shopping, community activity, and yard work  PERSONAL FACTORS: 1-2 comorbidities: Obesity and T2DM  are also affecting patient's functional outcome.   REHAB POTENTIAL: Good  CLINICAL DECISION MAKING: Stable/uncomplicated  EVALUATION COMPLEXITY: Low   GOALS: Goals reviewed with patient? No  SHORT TERM GOALS: Target date: 09/10/2023 Patient will demonstrate undestanding of home exercise plan by performing exercises correctly with evidence of good carry over with min to no verbal or tactile cues .   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 [facility] et al 1999) Baseline: 63/80  Goal status: INITIAL  2.  Patient will improve her right ankle strength by 1 grade MMT for improved right ankle stability and function to avoid future sprains.  Baseline: Ankle PF R/L 4/5   Goal status: INITIAL  3. Patient will improve right ankle mobility/AROM to be nearly symmetrical with left ankle (<=10%) to improve right ankle function and to decrease risk of re-injuring right ankle.  Baseline: Ankle DF R/L 15/20  Goal status:  INITIAL    PLAN:  PT FREQUENCY: 1-2x/week  PT DURATION: 10 weeks  PLANNED INTERVENTIONS: 97164- PT Re-evaluation, 97750- Physical Performance Testing, 97110-Therapeutic exercises, 97530- Therapeutic activity, [id number]- Neuromuscular re-education, 97535- Self Care, 16109- Manual therapy, [id number]- Gait training, [phone]- Orthotic Initial, [phone]- Orthotic/Prosthetic subsequent, [phone]- Aquatic Therapy, [phone]- Splinting, Z3086- Electrical stimulation (unattended), [phone]- Electrical stimulation (manual), [id number]- Ultrasound, [id number]- Traction (mechanical), Patient/Family education, Balance training, Stair training, Taping, Dry Needling, Joint mobilization, Joint manipulation, Spinal manipulation, Spinal mobilization, DME instructions, Cryotherapy, and Moist heat  PLAN FOR NEXT SESSION:  Measure hip MMT and PROM of ankle DF. Observe gait mechanics. Squat and step over and single leg stance. Progress right ankle ROM and strength: single leg heel raises and  standing calf stretch on step. Single leg stance in corner   Janine Melbourne, PT, DPT Physical Therapist - [place]  [facility]

## 2023-09-19 ENCOUNTER — Ambulatory Visit: Payer: Self-pay | Admitting: Physical Therapy

## 2023-09-19 DIAGNOSIS — Z713 Dietary counseling and surveillance: Secondary | ICD-10-CM | POA: Diagnosis not present

## 2023-09-20 ENCOUNTER — Ambulatory Visit: Admitting: Sports Medicine

## 2023-09-23 ENCOUNTER — Ambulatory Visit: Payer: Self-pay | Admitting: Physical Therapy

## 2023-09-23 DIAGNOSIS — Z634 Disappearance and death of family member: Secondary | ICD-10-CM | POA: Diagnosis not present

## 2023-09-23 DIAGNOSIS — F4323 Adjustment disorder with mixed anxiety and depressed mood: Secondary | ICD-10-CM | POA: Diagnosis not present

## 2023-10-01 ENCOUNTER — Encounter: Payer: Self-pay | Admitting: Physical Therapy

## 2023-10-02 ENCOUNTER — Ambulatory Visit: Admitting: "Endocrinology

## 2023-10-07 ENCOUNTER — Telehealth: Payer: Self-pay

## 2023-10-07 ENCOUNTER — Ambulatory Visit: Attending: Sports Medicine | Admitting: Physical Therapy

## 2023-10-07 NOTE — Telephone Encounter (Signed)
 Pt did not arrive for scheduled appointment. No telephone call nor message preceeded this absence. Author attempted to contact pt via telephone number listed in chart. Left HIPAA secure message for patient to call clinic.   11:15 AM, 10/07/23 Dawn Eth, PT, DPT Physical Therapist - [place] [phone] (Office)

## 2023-10-09 ENCOUNTER — Ambulatory Visit: Admitting: Physical Therapy

## 2023-10-09 ENCOUNTER — Encounter: Payer: Self-pay | Admitting: Urgent Care

## 2023-10-10 ENCOUNTER — Ambulatory Visit: Payer: BC Managed Care – PPO | Admitting: Urgent Care

## 2023-10-10 ENCOUNTER — Encounter: Payer: Self-pay | Admitting: Urgent Care

## 2023-10-10 VITALS — BP 102/70 | HR 85 | Wt 254.4 lb

## 2023-10-10 DIAGNOSIS — R635 Abnormal weight gain: Secondary | ICD-10-CM

## 2023-10-10 DIAGNOSIS — Z6841 Body Mass Index (BMI) 40.0 and over, adult: Secondary | ICD-10-CM | POA: Diagnosis not present

## 2023-10-10 DIAGNOSIS — D75839 Thrombocytosis, unspecified: Secondary | ICD-10-CM | POA: Diagnosis not present

## 2023-10-10 DIAGNOSIS — R5383 Other fatigue: Secondary | ICD-10-CM | POA: Diagnosis not present

## 2023-10-10 DIAGNOSIS — L83 Acanthosis nigricans: Secondary | ICD-10-CM | POA: Diagnosis not present

## 2023-10-10 DIAGNOSIS — E88819 Insulin resistance, unspecified: Secondary | ICD-10-CM | POA: Diagnosis not present

## 2023-10-10 DIAGNOSIS — E063 Autoimmune thyroiditis: Secondary | ICD-10-CM

## 2023-10-10 LAB — HEMOGLOBIN A1C: Hgb A1c MFr Bld: 5.4 % (ref 4.6–6.5)

## 2023-10-10 LAB — CBC WITH DIFFERENTIAL/PLATELET
Basophils Absolute: 0.1 10*3/uL (ref 0.0–0.1)
Basophils Relative: 1.2 % (ref 0.0–3.0)
Eosinophils Absolute: 0.1 10*3/uL (ref 0.0–0.7)
Eosinophils Relative: 1.3 % (ref 0.0–5.0)
HCT: 40.5 % (ref 36.0–46.0)
Hemoglobin: 13.2 g/dL (ref 12.0–15.0)
Lymphocytes Relative: 22 % (ref 12.0–46.0)
Lymphs Abs: 2.3 10*3/uL (ref 0.7–4.0)
MCHC: 32.6 g/dL (ref 30.0–36.0)
MCV: 82.4 fl (ref 78.0–100.0)
Monocytes Absolute: 0.6 10*3/uL (ref 0.1–1.0)
Monocytes Relative: 5.9 % (ref 3.0–12.0)
Neutro Abs: 7.1 10*3/uL (ref 1.4–7.7)
Neutrophils Relative %: 69.6 % (ref 43.0–77.0)
Platelets: 362 10*3/uL (ref 150.0–400.0)
RBC: 4.92 Mil/uL (ref 3.87–5.11)
RDW: 15.1 % (ref 11.5–15.5)
WBC: 10.3 10*3/uL (ref 4.0–10.5)

## 2023-10-10 LAB — TSH: TSH: 3.35 u[IU]/mL (ref 0.35–5.50)

## 2023-10-10 LAB — VITAMIN D 25 HYDROXY (VIT D DEFICIENCY, FRACTURES): VITD: 22.03 ng/mL — ABNORMAL LOW (ref 30.00–100.00)

## 2023-10-10 LAB — B12 AND FOLATE PANEL
Folate: 23 ng/mL (ref >5.9)
Vitamin B-12: 349 pg/mL (ref 211–911)

## 2023-10-10 LAB — T4, FREE: Free T4: 1.06 ng/dL (ref 0.60–1.60)

## 2023-10-10 MED ORDER — NALTREXONE POWD: 4.0000 mg | Freq: Every day | 1 refills | Status: AC

## 2023-10-10 MED ORDER — METFORMIN HCL 500 MG PO TABS: 500.0000 mg | ORAL_TABLET | Freq: Every day | ORAL | 1 refills | Status: DC

## 2023-10-10 NOTE — Patient Instructions (Addendum)
 [facility] - [place], [place] - [address]  [phone]   Please call the above phone number to discuss the low dose naltrexone compound. This can be beneficial for targeting your thyroid  antibodies and help with fatigue levels. If you start it, please take 4mg  daily. CONTINUE your thyroid  medication.   Please schedule a follow up visit in about 3 months at the following locations: [facility] [address], [place], [place] [address] Phone: [phone]

## 2023-10-10 NOTE — Progress Notes (Signed)
 Established Patient Office Visit  Subjective:  Patient ID: Kristie Santiago, female    DOB: [birth year]  Age: 33 y.o. MRN: [id number]  Chief Complaint  Patient presents with   Follow-up    6 month follow up. Pt is fasting.    HPI  Discussed the use of AI scribe software for clinical note transcription with the patient, who gave verbal consent to proceed.  History of Present Illness   Kristie Santiago is a 33 year old female with elevated TPO antibodies who presents with fatigue.  She experiences fatigue described as feeling 'washed out' and tired, though she remains functional. This fatigue is managed while caring for an active toddler. She is attempting to increase physical activity by walking a few times a week and is working on improving her diet.  She is currently taking phentermine  30 mg daily and metformin , one tablet with breakfast, and requires a refill for metformin  as her supply will run out soon. She tolerates metformin  well and has not experienced adverse effects from phentermine , such as high blood pressure, tachycardia, insomnia, anxiety, or jitteriness.  Her lab results from January indicated normal kidney and liver function, glucose, electrolytes, insulin , and A1c levels. However, her TPO antibody level was significantly elevated at 518, despite normal thyroid  labs from November. She has been monitoring her weight weekly.  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 She takes Zyrtec regularly for allergies.      Patient Active Problem List   Diagnosis Date Noted   Insulin  resistance 04/11/2023   Thrombocytosis 04/11/2023   Family history of cardiovascular disease 04/11/2023   Chicken pox 03/14/2023   Acanthosis nigricans 03/14/2023   Abnormal weight gain 03/14/2023   Hirsutism 03/14/2023   Striae purple 03/14/2023   Telangiectasias  03/14/2023   BMI 40.0-44.9, adult (HCC) 03/14/2023   Fatigue 03/14/2023   Sprain of left ankle 03/14/2023   Hypothyroidism 05/23/2021   Past Medical History:  Diagnosis Date   Thyroid  disease 05/2021   Past Surgical History:  Procedure Laterality Date   CESAREAN SECTION  2024   CHOLECYSTECTOMY  2018   WISDOM TOOTH EXTRACTION     Social History   Tobacco Use   Smoking status: Never   Smokeless tobacco: Never  Vaping Use   Vaping status: Never Used  Substance Use Topics   Alcohol use: Yes   Drug use: No      ROS: as noted in HPI  Objective:     BP 102/70   Pulse 85   Wt 254 lb 6.4 oz (115.4 kg)   SpO2 99%   BMI 42.60 kg/m  BP Readings from Last 3 Encounters:  10/10/23 102/70  08/09/23 120/70  06/03/23 122/74   Wt Readings from Last 3 Encounters:  10/10/23 254 lb 6.4 oz (115.4 kg)  09/10/23 253 lb (114.8 kg)  08/09/23 259 lb 12.8 oz (117.8 kg)      Physical Exam Vitals and nursing note reviewed.  Constitutional:      General: She is not in acute distress.    Appearance: Normal appearance. She is not ill-appearing, toxic-appearing or diaphoretic.  HENT:     Head: Normocephalic and atraumatic.     Mouth/Throat:     Mouth: Mucous membranes are moist.  Eyes:     General: No scleral icterus.       Right eye: No discharge.  Left eye: No discharge.     Extraocular Movements: Extraocular movements intact.     Pupils: Pupils are equal, round, and reactive to light.  Neck:     Thyroid : No thyroid  mass, thyromegaly or thyroid  tenderness.  Cardiovascular:     Rate and Rhythm: Normal rate.  Pulmonary:     Effort: Pulmonary effort is normal. No respiratory distress.  Musculoskeletal:     Cervical back: Normal range of motion and neck supple. No rigidity or tenderness.  Lymphadenopathy:     Cervical: No cervical adenopathy.  Skin:    General: Skin is warm and dry.     Coloration: Skin is not jaundiced.     Findings: No bruising or erythema.   Neurological:     General: No focal deficit present.     Mental Status: She is alert and oriented to person, place, and time.     Gait: Gait normal.  Psychiatric:        Mood and Affect: Mood normal.        Behavior: Behavior normal.      No results found for any visits on 10/10/23.  Last CBC Lab Results  Component Value Date   WBC 9.4 03/19/2023   HGB 14.5 03/19/2023   HCT 43.5 03/19/2023   MCV 84.3 03/19/2023   RDW 14.0 03/19/2023   PLT 407.0 (H) 03/19/2023   Last metabolic panel Lab Results  Component Value Date   GLUCOSE 76 06/03/2023   NA 138 06/03/2023   K 4.4 06/03/2023   CL 105 06/03/2023   CO2 25 06/03/2023   BUN 18 06/03/2023   CREATININE 0.58 06/03/2023   GFR 116.23 03/19/2023   CALCIUM 9.4 06/03/2023   PROT 7.7 06/03/2023   ALBUMIN 4.2 03/19/2023   BILITOT 0.2 06/03/2023   ALKPHOS 97 03/19/2023   AST 14 06/03/2023   ALT 15 06/03/2023   Last lipids Lab Results  Component Value Date   CHOL 164 06/03/2023   HDL 42 (L) 06/03/2023   LDLCALC 101 (H) 06/03/2023   TRIG 114 06/03/2023   CHOLHDL 3.9 06/03/2023   Last hemoglobin A1c Lab Results  Component Value Date   HGBA1C 5.5 06/03/2023   Last thyroid  functions Lab Results  Component Value Date   TSH 2.87 03/19/2023   T4TOTAL 10.8 07/19/2021   Last vitamin D No results found for: "25OHVITD2", "25OHVITD3", "VD25OH" Last vitamin B12 and Folate No results found for: "VITAMINB12", "FOLATE"    The ASCVD Risk score (Arnett DK, et al., 2019) failed to calculate for the following reasons:   The 2019 ASCVD risk score is only valid for ages 37 to 49  Assessment & Plan:  Hypothyroidism due to Hashimoto thyroiditis -     TSH -     T4, free -     Naltrexone; Take 4 mg by mouth daily.  Dispense: 1000 g; Refill: 1  Insulin  resistance -     metFORMIN  HCl; Take 1 tablet (500 mg total) by mouth daily with breakfast.  Dispense: 90 tablet; Refill: 1 -     Hemoglobin A1c  Acanthosis nigricans -      metFORMIN  HCl; Take 1 tablet (500 mg total) by mouth daily with breakfast.  Dispense: 90 tablet; Refill: 1 -     Hemoglobin A1c  Abnormal weight gain -     metFORMIN  HCl; Take 1 tablet (500 mg total) by mouth daily with breakfast.  Dispense: 90 tablet; Refill: 1 -     Hemoglobin A1c  BMI 40.0-44.9,  adult [facility]) -     metFORMIN  HCl; Take 1 tablet (500 mg total) by mouth daily with breakfast.  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 Discussed low-dose naltrexone for antibody reduction. Explained compounding pharmacy requirement for 4 mg dose. Patient agreed to trial. - Prescribe low-dose naltrexone 4 mg from a compounding pharmacy. - Repeat TPO antibody levels in 3 months. - Check vitamin D and vitamin B12 levels.  Insulin  Resistance Insulin  resistance managed with metformin , effective and well-tolerated. No need to repeat insulin  levels. - Refill metformin  prescription, 90-day supply.  Weight Management Weight loss efforts supported by phentermine  30 mg, well-tolerated. No adverse effects reported. - Continue phentermine  30 mg daily. - Encourage exercise and dietary modifications.  General Health Maintenance Recent labs normal except slightly elevated LDL. Health maintenance up to date.  Follow-up Follow-up needed for treatment assessment and condition management. - Schedule follow-up in 3 months for TPO antibody levels and naltrexone efficacy. - Ensure thyroid  labs (TSH and free T4) are completed. - Consider fasting for thyroid  labs.         Return in about 3 months (around 01/10/2024).   Mandy Second, PA

## 2023-10-11 ENCOUNTER — Ambulatory Visit: Payer: Self-pay | Admitting: Urgent Care

## 2023-10-11 DIAGNOSIS — E559 Vitamin D deficiency, unspecified: Secondary | ICD-10-CM

## 2023-10-11 LAB — COMPLETE METABOLIC PANEL WITHOUT GFR
AG Ratio: 1.3 (calc) (ref 1.0–2.5)
ALT: 18 U/L (ref 6–29)
AST: 12 U/L (ref 10–30)
Albumin: 4 g/dL (ref 3.6–5.1)
Alkaline phosphatase (APISO): 100 U/L (ref 31–125)
BUN: 10 mg/dL (ref 7–25)
CO2: 26 mmol/L (ref 20–32)
Calcium: 9.1 mg/dL (ref 8.6–10.2)
Chloride: 106 mmol/L (ref 98–110)
Creat: 0.56 mg/dL (ref 0.50–0.97)
Globulin: 3.1 g/dL (ref 1.9–3.7)
Glucose, Bld: 82 mg/dL (ref 65–99)
Potassium: 4.7 mmol/L (ref 3.5–5.3)
Sodium: 138 mmol/L (ref 135–146)
Total Bilirubin: 0.4 mg/dL (ref 0.2–1.2)
Total Protein: 7.1 g/dL (ref 6.1–8.1)

## 2023-10-11 MED ORDER — VITAMIN D (ERGOCALCIFEROL) 1.25 MG (50000 UNIT) PO CAPS: 50000.0000 [IU] | ORAL_CAPSULE | ORAL | 0 refills | Status: AC

## 2023-10-14 ENCOUNTER — Encounter: Payer: Self-pay | Admitting: Urgent Care

## 2023-10-14 ENCOUNTER — Ambulatory Visit: Admitting: Physical Therapy

## 2023-10-21 ENCOUNTER — Encounter: Admitting: Physical Therapy

## 2023-10-21 DIAGNOSIS — Z634 Disappearance and death of family member: Secondary | ICD-10-CM | POA: Diagnosis not present

## 2023-10-21 DIAGNOSIS — F4323 Adjustment disorder with mixed anxiety and depressed mood: Secondary | ICD-10-CM | POA: Diagnosis not present

## 2023-10-23 ENCOUNTER — Encounter: Admitting: Physical Therapy

## 2023-10-28 ENCOUNTER — Encounter: Admitting: Physical Therapy

## 2023-10-30 ENCOUNTER — Encounter: Admitting: Physical Therapy

## 2023-11-04 ENCOUNTER — Encounter: Admitting: Physical Therapy

## 2023-11-05 DIAGNOSIS — F4323 Adjustment disorder with mixed anxiety and depressed mood: Secondary | ICD-10-CM | POA: Diagnosis not present

## 2023-11-05 DIAGNOSIS — Z634 Disappearance and death of family member: Secondary | ICD-10-CM | POA: Diagnosis not present

## 2023-11-19 DIAGNOSIS — Z634 Disappearance and death of family member: Secondary | ICD-10-CM | POA: Diagnosis not present

## 2023-11-19 DIAGNOSIS — F4323 Adjustment disorder with mixed anxiety and depressed mood: Secondary | ICD-10-CM | POA: Diagnosis not present

## 2023-12-17 ENCOUNTER — Telehealth (INDEPENDENT_AMBULATORY_CARE_PROVIDER_SITE_OTHER): Admitting: "Endocrinology

## 2023-12-17 ENCOUNTER — Encounter: Payer: Self-pay | Admitting: "Endocrinology

## 2023-12-17 DIAGNOSIS — Z634 Disappearance and death of family member: Secondary | ICD-10-CM | POA: Diagnosis not present

## 2023-12-17 DIAGNOSIS — E039 Hypothyroidism, unspecified: Secondary | ICD-10-CM

## 2023-12-17 DIAGNOSIS — Z8632 Personal history of gestational diabetes: Secondary | ICD-10-CM

## 2023-12-17 DIAGNOSIS — F4323 Adjustment disorder with mixed anxiety and depressed mood: Secondary | ICD-10-CM | POA: Diagnosis not present

## 2023-12-17 MED ORDER — PHENTERMINE HCL 30 MG PO CAPS: 30.0000 mg | ORAL_CAPSULE | ORAL | 0 refills | Status: DC

## 2023-12-17 NOTE — Progress Notes (Signed)
 The patient reports they are currently: [place]. I spent 6  minutes on the video with the patient on the date of service. I spent an additional 5 minutes on pre- and post-visit activities on the date of service.   The patient was physically located in [place]  or a state in which I am permitted to provide care. The patient and/or parent/guardian understood that s/he may incur co-pays and cost sharing, and agreed to the telemedicine visit. The visit was reasonable and appropriate under the circumstances given the patient's presentation at the time.  The patient and/or parent/guardian has been advised of the potential risks and limitations of this mode of treatment (including, but not limited to, the absence of in-person examination) and has agreed to be treated using telemedicine. The patient's/patient's family's questions regarding telemedicine have been answered.   The patient and/or parent/guardian has also been advised to contact their provider's office for worsening conditions, and seek emergency medical treatment and/or call 911 if the patient deems either necessary.     Outpatient Endocrinology Note Kristie Birmingham, MD    Kristie Santiago [birth year] [id number]  Referring Provider: Lowella Benton CROME, PA Primary Care Provider: Lowella Benton CROME, [place] Reason for consultation: Subjective   Assessment & Plan  Diagnoses and all orders for this visit:  Morbid obesity (HCC)  Acquired hypothyroidism -     TSH + free T4  History of gestational diabetes  Other orders -     phentermine  30 MG capsule; Take 1 capsule (30 mg total) by mouth every morning.    Complain of slow weight gain over the course of years, and 15 to 20 pounds in the past year after patient had her child Patient reports following the lifestyle protocol prior to birth and did not benefit from any weight loss Currently on metformin  500 mg daily Interested in weight loss medications Discussed lifestyle changes,  medical management as well as bariatric surgery Discussed various medications, patient is not interested in a shot at this point 06/03/23: Ordered phentermine  15 mg qam, discussed side effects, no contraindications, weight 263 lbs, baseline labs WNL except high LDL 09/10/23: start phentermine  30 mg every day since no S/E and weight loss of 10 lbs (current weight 247 lbs, pre-Rx weight of 263lbs), maintain weekly weight log Maintain healthy lifestyle including 1200 Cal/day, 30 min of activity/day, avoiding refined/processed/outside food 20 minutes physical activity per day, in continuum or interruptedly through the day  Goals: less than 60 grams of carbohydrate/meal, 1200-1500 Cal/day, 10,0000 steps a day and weight loss of 0.5-1 lb/ wk  Sleep 7-9 hours/day, adapt good sleep hygiene Adapt de-stressing and relaxation techniques to prevent stress induced weight gain Avoid/switch medications that lead to weight gain by discussing with the prescribing physician   History of hypothyroidism since few years Currently taking levothyroxine  88 mcg, originally taking with other medications Last thyroid  labs WNL in 03/2023 Recommend to take levothyroxine  first thing in the morning on empty stomach and wait at least 30 minutes to 1 hour before eating or drinking anything or taking any other medications. Space out levothyroxine  by 4 hours from any acid reflux medication, fibrate, iron, calcium, multivitamin, birth control pills and nutritional supplements.    Return in about 3 months (around 03/11/2024) for visit + labs before next visit.   I have reviewed current medications, nurse's notes, allergies, vital signs, past medical and surgical history, family medical history, and social history for this encounter. Counseled patient on symptoms, examination findings, lab findings, imaging results, treatment  decisions and monitoring and prognosis. The patient understood the recommendations and agrees with the treatment  plan. All questions regarding treatment plan were fully answered.  Kristie Birmingham, MD  12/17/23   History of Present Illness HPI  Kristie Santiago is a 33 y.o. year old female who presents for follow-up of weight gain and hypothyroidism.  Patient took phentermine  30 mg every morning without any side effects, weight was 263 lbs at that time, current weight today is 227 pounds Patient ran out of phentermine   Patient not calorie counting at this time Taking levothyroxine  88 mcg po every day, takes it appropriately   Initial history: C/o weight gain of 15-20 lbs in past year when she had a baby  Prior to that, patient had a slow weight gain over the course of years, no dramatic or sudden weight gain Patient reports having an active lifestyle  Has history of gestational diabetes, was able to get pregnant after 1 year on her own  Hypothyroidism was diagnosed a few years ago Currently takes levothyroxine  88 mcg p.o. daily, takes with other medications Patient currently has no plans for pregnancy but may plan it in the future  Physical Exam  Ht 5' 4 (1.626 m)   Wt 247 lb 9.6 oz (112.3 kg)   BMI 42.50 kg/m    Constitutional: well developed, well nourished Head: normocephalic, atraumatic Eyes: sclera anicteric, no redness Neck: supple Lungs: normal respiratory effort Neurology: alert and oriented Skin: dry, no appreciable rashes Musculoskeletal: no appreciable defects Psychiatric: normal mood and affect   Current Medications Patient's Medications  New Prescriptions   No medications on file  Previous Medications   CETIRIZINE (ZYRTEC) 10 MG TABLET    Take by mouth.   LEVOTHYROXINE  (SYNTHROID ) 88 MCG TABLET    Take by mouth.   METFORMIN  (GLUCOPHAGE ) 500 MG TABLET    Take 1 tablet (500 mg total) by mouth daily with breakfast.   NALTREXONE  POWD    Take 4 mg by mouth daily.   VITAMIN D , ERGOCALCIFEROL , (DRISDOL ) 1.25 MG (50000 UNIT) CAPS CAPSULE    Take 1 capsule (50,000 Units  total) by mouth every 7 (seven) days.  Modified Medications   Modified Medication Previous Medication   PHENTERMINE  30 MG CAPSULE phentermine  30 MG capsule      Take 1 capsule (30 mg total) by mouth every morning.    Take 1 capsule (30 mg total) by mouth every morning.  Discontinued Medications   No medications on file    Allergies Allergies  Allergen Reactions   Other     Allergic to cats    Past Medical History Past Medical History:  Diagnosis Date   Thyroid  disease 05/2021    Past Surgical History Past Surgical History:  Procedure Laterality Date   CESAREAN SECTION  2024   CHOLECYSTECTOMY  2018   WISDOM TOOTH EXTRACTION      Family History family history includes Alzheimer's disease in her maternal grandmother; Cancer in her paternal grandfather; Diabetes in her paternal grandfather and paternal grandmother; Hearing loss in her father; Heart disease in her father; Mental illness in her maternal grandmother.  Social History Social History   Socioeconomic History   Marital status: Married    Spouse name: Not on file   Number of children: 1   Years of education: Not on file   Highest education level: Bachelor's degree (e.g., BA, AB, BS)  Occupational History   Not on file  Tobacco Use   Smoking status: Never   Smokeless  tobacco: Never  Vaping Use   Vaping status: Never Used  Substance and Sexual Activity   Alcohol use: Yes   Drug use: No   Sexual activity: Yes    Partners: Male    Birth control/protection: None  Other Topics Concern   Not on file  Social History Narrative   Not on file   Social Drivers of Health   Financial Resource Strain: Medium Risk (10/09/2023)   Overall Financial Resource Strain (CARDIA)    Difficulty of Paying Living Expenses: Somewhat hard  Food Insecurity: No Food Insecurity (10/09/2023)   Hunger Vital Sign    Worried About Running Out of Food in the Last Year: Never true    Ran Out of Food in the Last Year: Never true   Transportation Needs: No Transportation Needs (10/09/2023)   PRAPARE - Administrator, Civil Service (Medical): No    Lack of Transportation (Non-Medical): No  Physical Activity: Insufficiently Active (10/09/2023)   Exercise Vital Sign    Days of Exercise per Week: 2 days    Minutes of Exercise per Session: 20 min  Stress: No Stress Concern Present (10/09/2023)   [employer] of Occupational Health - Occupational Stress Questionnaire    Feeling of Stress : Not at all  Social Connections: Socially Integrated (10/09/2023)   Social Connection and Isolation Panel    Frequency of Communication with Friends and Family: Twice a week    Frequency of Social Gatherings with Friends and Family: Once a week    Attends Religious Services: More than 4 times per year    Active Member of Clubs or Organizations: Yes    Attends Banker Meetings: More than 4 times per year    Marital Status: Married  Intimate Partner Violence: Unknown (08/07/2021)   Received from [facility]   HITS    Physically Hurt: Not on file    Insult or Talk Down To: Not on file    Threaten Physical Harm: Not on file    Scream or Curse: Not on file    Lab Results  Component Value Date   CHOL 164 06/03/2023   Lab Results  Component Value Date   HDL 42 (L) 06/03/2023   Lab Results  Component Value Date   LDLCALC 101 (H) 06/03/2023   Lab Results  Component Value Date   TRIG 114 06/03/2023   Lab Results  Component Value Date   CHOLHDL 3.9 06/03/2023   Lab Results  Component Value Date   CREATININE 0.56 10/10/2023   Lab Results  Component Value Date   GFR 116.23 03/19/2023      Component Value Date/Time   NA 138 10/10/2023 0832   K 4.7 10/10/2023 0832   CL 106 10/10/2023 0832   CO2 26 10/10/2023 0832   GLUCOSE 82 10/10/2023 0832   BUN 10 10/10/2023 0832   CREATININE 0.56 10/10/2023 0832   CALCIUM 9.1 10/10/2023 0832   PROT 7.1 10/10/2023 0832   ALBUMIN 4.2 03/19/2023 0751   AST  12 10/10/2023 0832   ALT 18 10/10/2023 0832   ALKPHOS 97 03/19/2023 0751   BILITOT 0.4 10/10/2023 0832      Latest Ref Rng & Units 10/10/2023    8:32 AM 06/03/2023    8:54 AM 03/19/2023    7:51 AM  BMP  Glucose 65 - 99 mg/dL 82  76  87   BUN 7 - 25 mg/dL 10  18  13    Creatinine 0.50 - 0.97 mg/dL  0.56  0.58  0.65   BUN/Creat Ratio 6 - 22 (calc) SEE NOTE:  SEE NOTE:    Sodium 135 - 146 mmol/L 138  138  140   Potassium 3.5 - 5.3 mmol/L 4.7  4.4  4.3   Chloride 98 - 110 mmol/L 106  105  104   CO2 20 - 32 mmol/L 26  25  29    Calcium 8.6 - 10.2 mg/dL 9.1  9.4  9.3        Component Value Date/Time   WBC 10.3 10/10/2023 0832   RBC 4.92 10/10/2023 0832   HGB 13.2 10/10/2023 0832   HCT 40.5 10/10/2023 0832   PLT 362.0 10/10/2023 0832   MCV 82.4 10/10/2023 0832   MCHC 32.6 10/10/2023 0832   RDW 15.1 10/10/2023 0832   LYMPHSABS 2.3 10/10/2023 0832   MONOABS 0.6 10/10/2023 0832   EOSABS 0.1 10/10/2023 0832   BASOSABS 0.1 10/10/2023 0832   Lab Results  Component Value Date   TSH 3.35 10/10/2023   TSH 2.87 03/19/2023   TSH 0.385 (L) 07/19/2021   FREET4 1.06 10/10/2023   FREET4 0.98 03/19/2023         Parts of this note may have been dictated using voice recognition software. There may be variances in spelling and vocabulary which are unintentional. Not all errors are proofread. Please notify the dino if any discrepancies are noted or if the meaning of any statement is not clear.

## 2023-12-30 DIAGNOSIS — F4323 Adjustment disorder with mixed anxiety and depressed mood: Secondary | ICD-10-CM | POA: Diagnosis not present

## 2023-12-30 DIAGNOSIS — Z634 Disappearance and death of family member: Secondary | ICD-10-CM | POA: Diagnosis not present

## 2024-01-23 DIAGNOSIS — F4323 Adjustment disorder with mixed anxiety and depressed mood: Secondary | ICD-10-CM | POA: Diagnosis not present

## 2024-01-23 DIAGNOSIS — Z634 Disappearance and death of family member: Secondary | ICD-10-CM | POA: Diagnosis not present

## 2024-02-11 DIAGNOSIS — Z634 Disappearance and death of family member: Secondary | ICD-10-CM | POA: Diagnosis not present

## 2024-02-11 DIAGNOSIS — F4323 Adjustment disorder with mixed anxiety and depressed mood: Secondary | ICD-10-CM | POA: Diagnosis not present

## 2024-02-24 NOTE — Progress Notes (Unsigned)
 [facility] [address] [place] [address] Phone: [phone]   Assessment and Plan:     1. 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  Patient elected for repeat trigger finger injection today.  Tolerated well per note below - May continue to use topical Voltaren gel as needed  Procedure: Flexor Tendon Sheath Injection (Trigger Finger) Side: Right 3rd digit  Indication: Trigger Finger  After explaining the procedure, viable alternatives, risks, and answering any questions, consent was given verbally.  The site was cleaned with alcohol prep.  A steroid injection was performed under ultrasound guidance using 0.5ml of 1% lidocaine without epinephrine and 20 mg of methylprednisone 40 mg/mL with sterile technique.  This was well tolerated and resulted in  relief.  Needle was removed and dressing placed and post injection instructions were given including  a discussion of likely return of pain today after the anesthetic wears off (with the possibility of worsened pain) until the steroid starts to work in 3-5 days.   Pt was advised to call or return to clinic if these symptoms worsen or fail to improve as anticipated.  If not at least 50% better in 6 weeks would consider repeat injection. Images permanently stored.    Pertinent previous records reviewed include none   Follow Up: As needed if no improvement or worsening of symptoms.  Could consider repeat CSI versus referral to orthopedic hand surgery for trigger finger release   Subjective:   I, Vianny Schraeder, am serving as a Neurosurgeon for Doctor Morene Mace  Chief Complaint: right hand pain   HPI:   02/25/2024 Patient is a 33 year old  female with right hand pain. Patient states third digit right hand is catching again. Started about a month ago consistently    Relevant Historical Information: None    Additional pertinent review of systems negative.   Current Outpatient Medications:    cetirizine (ZYRTEC) 10 MG tablet, Take by mouth., Disp: , Rfl:    levothyroxine  (SYNTHROID ) 88 MCG tablet, Take by mouth., Disp: , Rfl:    metFORMIN  (GLUCOPHAGE ) 500 MG tablet, Take 1 tablet (500 mg total) by mouth daily with breakfast., Disp: 90 tablet, Rfl: 1   Naltrexone  POWD, Take 4 mg by mouth daily., Disp: 1000 g, Rfl: 1   phentermine  30 MG capsule, Take 1 capsule (30 mg total) by mouth every morning., Disp: 90 capsule, Rfl: 0   Vitamin D , Ergocalciferol , (DRISDOL ) 1.25 MG (50000 UNIT) CAPS capsule, Take 1 capsule (50,000 Units total) by mouth every 7 (seven) days., Disp: 12 capsule, Rfl: 0   Objective:     Vitals:   02/25/24 0903  BP: 110/68  Weight: 247 lb (112 kg)  Height: 5' 4 (1.626 m)      Body mass index is 42.4 kg/m.    Physical Exam:    General: Appears well, nad, nontoxic and pleasant Neuro:sensation intact, strength is 5/5 with df/pf/inv/ev, muscle tone wnl Skin:no susupicious lesions or rashes   Right hand/wrist: No deformity or swelling appreciated. ROM  Ext 90, flexion70, radial/ulnar deviation 30 Right palpable nodule at base of third MCP that is mobile with third digit flexion and extension and TTP   nttp over the snuff box, dorsal carpals, volar carpals,  radial styloid, ulnar styloid, 1st mcp, tfcc Negative Tinel's     Electronically signed by:  Odis Mace [facility] 9:19 AM 02/25/24

## 2024-02-25 ENCOUNTER — Ambulatory Visit (INDEPENDENT_AMBULATORY_CARE_PROVIDER_SITE_OTHER): Admitting: Sports Medicine

## 2024-02-25 VITALS — BP 110/68 | Ht 64.0 in | Wt 247.0 lb

## 2024-02-25 DIAGNOSIS — M65331 Trigger finger, right middle finger: Secondary | ICD-10-CM | POA: Diagnosis not present

## 2024-02-25 NOTE — Patient Instructions (Signed)
 As needed follow up   Could refer to ortho if trigger finger operation is wanted

## 2024-03-02 DIAGNOSIS — F4323 Adjustment disorder with mixed anxiety and depressed mood: Secondary | ICD-10-CM | POA: Diagnosis not present

## 2024-03-02 DIAGNOSIS — Z634 Disappearance and death of family member: Secondary | ICD-10-CM | POA: Diagnosis not present

## 2024-03-17 ENCOUNTER — Telehealth (INDEPENDENT_AMBULATORY_CARE_PROVIDER_SITE_OTHER): Admitting: "Endocrinology

## 2024-03-17 ENCOUNTER — Encounter: Payer: Self-pay | Admitting: "Endocrinology

## 2024-03-17 VITALS — Ht 64.0 in | Wt 241.0 lb

## 2024-03-17 DIAGNOSIS — E039 Hypothyroidism, unspecified: Secondary | ICD-10-CM | POA: Diagnosis not present

## 2024-03-17 DIAGNOSIS — Z8632 Personal history of gestational diabetes: Secondary | ICD-10-CM | POA: Diagnosis not present

## 2024-03-17 MED ORDER — PHENTERMINE-TOPIRAMATE ER 7.5-46 MG PO CP24: 1.0000 | ORAL_CAPSULE | Freq: Every day | ORAL | 0 refills | Status: AC

## 2024-03-17 NOTE — Patient Instructions (Signed)
Maintain healthy lifestyle including 1200 Cal/day, 30 min of activity/day, avoiding refined/processed/outside food 20 minutes physical activity per day, in continuum or interruptedly through the day  Goals: less than 60 grams of carbohydrate/meal, 1200-1500 Cal/day, 10,0000 steps a day and weight loss of 0.5-1 lb/ wk  Sleep 7-9 hours/day, adapt good sleep hygiene Adapt de-stressing and relaxation techniques to prevent stress induced weight gain Avoid/switch medications that lead to weight gain by discussing with the prescribing physician

## 2024-03-17 NOTE — Progress Notes (Signed)
 The patient reports they are currently: Kristie Santiago. I spent 7 minutes on the video with the patient on the date of service. I spent an additional 5 minutes on pre- and post-visit activities on the date of service.   The patient was physically located in [place]  or a state in which I am permitted to provide care. The patient and/or parent/guardian understood that s/he may incur co-pays and cost sharing, and agreed to the telemedicine visit. The visit was reasonable and appropriate under the circumstances given the patient's presentation at the time.  The patient and/or parent/guardian has been advised of the potential risks and limitations of this mode of treatment (including, but not limited to, the absence of in-person examination) and has agreed to be treated using telemedicine. The patient's/patient's family's questions regarding telemedicine have been answered.   The patient and/or parent/guardian has also been advised to contact their provider's office for worsening conditions, and seek emergency medical treatment and/or call 911 if the patient deems either necessary.     Outpatient Endocrinology Note Obadiah Birmingham, MD    Kristie Santiago Apr 25, 1991 [id number]  Referring Provider: Lowella Benton CROME, PA Primary Care Provider: Lowella Benton CROME, [place] Reason for consultation: Subjective   Assessment & Plan  Diagnoses and all orders for this visit:  Morbid obesity (HCC)  Acquired hypothyroidism  History of gestational diabetes  Other orders -     Phentermine -Topiramate ER (QSYMIA) 7.5-46 MG CP24; Take 1 capsule by mouth daily in the afternoon.   Complain of slow weight gain over the course of years, and 15 to 20 pounds in the past year after patient had her child Patient reports following the lifestyle protocol prior to birth and did not benefit from any weight loss Currently on metformin  500 mg daily Interested in weight loss medications Discussed lifestyle changes, medical  management as well as bariatric surgery Discussed various medications, patient is not interested in a shot at this point 06/03/23: Ordered phentermine  15 mg qam, discussed side effects, no contraindications, weight 263 lbs, baseline labs WNL except high LDL 09/10/23: Started phentermine  30 mg every day since no S/E and weight loss of 10 lbs (current weight 241 lbs, pre-Rx weight of 263lbs), maintain weekly weight log 03/17/24: Needs to have 6 mo break off of phenteramine, prescribed qsymia 7.5-64 mg every day  Maintain healthy lifestyle including 1200 Cal/day, 30 min of activity/day, avoiding refined/processed/outside food 20 minutes physical activity per day, in continuum or interruptedly through the day  Goals: less than 60 grams of carbohydrate/meal, 1200-1500 Cal/day, 10,0000 steps a day and weight loss of 0.5-1 lb/ wk  Sleep 7-9 hours/day, adapt good sleep hygiene Adapt de-stressing and relaxation techniques to prevent stress induced weight gain Avoid/switch medications that lead to weight gain by discussing with the prescribing physician   History of hypothyroidism since few years Currently taking levothyroxine  88 mcg, originally taking with other medications Last thyroid  labs WNL in 10/2023 Recommend to take levothyroxine  first thing in the morning on empty stomach and wait at least 30 minutes to 1 hour before eating or drinking anything or taking any other medications. Space out levothyroxine  by 4 hours from any acid reflux medication, fibrate, iron, calcium, multivitamin, birth control pills and nutritional supplements.    Return in about 3 months (around 06/17/2024) for visit, labs today.   I have reviewed current medications, nurse's notes, allergies, vital signs, past medical and surgical history, family medical history, and social history for this encounter. Counseled patient on symptoms, examination findings, lab  findings, imaging results, treatment decisions and monitoring and  prognosis. The patient understood the recommendations and agrees with the treatment plan. All questions regarding treatment plan were fully answered.  Obadiah Birmingham, MD  03/17/24   History of Present Illness HPI  Kristie Santiago is a 33 y.o. year old female who presents for follow-up of weight gain and hypothyroidism.  Patient is taking phentermine  30 mg every morning without any side effects, weight was 263 lbs at that time, current weight today is 241 pounds Hypothyroidism was diagnosed a few years ago Taking levothyroxine  88 mcg po every day, takes it appropriately   Initial history: Patient not calorie counting at this time C/o weight gain of 15-20 lbs in past year when she had a baby  Prior to that, patient had a slow weight gain over the course of years, no dramatic or sudden weight gain Patient reports having an active lifestyle  Has history of gestational diabetes, was able to get pregnant after 1 year on her own  Currently takes levothyroxine  88 mcg p.o. daily, takes with other medications Patient currently has no plans for pregnancy but may plan it in the future  Physical Exam  Ht 5' 4 (1.626 m)   Wt 241 lb (109.3 kg)   BMI 41.37 kg/m    Constitutional: well developed, well nourished Head: normocephalic, atraumatic Eyes: sclera anicteric, no redness Neck: supple Lungs: normal respiratory effort Neurology: alert and oriented Skin: dry, no appreciable rashes Musculoskeletal: no appreciable defects Psychiatric: normal mood and affect   Current Medications Patient's Medications  New Prescriptions   PHENTERMINE -TOPIRAMATE ER (QSYMIA) 7.5-46 MG CP24    Take 1 capsule by mouth daily in the afternoon.  Previous Medications   CETIRIZINE (ZYRTEC) 10 MG TABLET    Take by mouth.   LEVOTHYROXINE  (SYNTHROID ) 88 MCG TABLET    Take by mouth.   METFORMIN  (GLUCOPHAGE ) 500 MG TABLET    Take 1 tablet (500 mg total) by mouth daily with breakfast.   NALTREXONE  POWD    Take 4 mg  by mouth daily.   VITAMIN D , ERGOCALCIFEROL , (DRISDOL ) 1.25 MG (50000 UNIT) CAPS CAPSULE    Take 1 capsule (50,000 Units total) by mouth every 7 (seven) days.  Modified Medications   No medications on file  Discontinued Medications   PHENTERMINE  30 MG CAPSULE    Take 1 capsule (30 mg total) by mouth every morning.    Allergies Allergies  Allergen Reactions   Other     Allergic to cats    Past Medical History Past Medical History:  Diagnosis Date   Thyroid  disease 05/2021    Past Surgical History Past Surgical History:  Procedure Laterality Date   CESAREAN SECTION  2024   CHOLECYSTECTOMY  2018   WISDOM TOOTH EXTRACTION      Family History family history includes Alzheimer's disease in her maternal grandmother; Cancer in her paternal grandfather; Diabetes in her paternal grandfather and paternal grandmother; Hearing loss in her father; Heart disease in her father; Mental illness in her maternal grandmother.  Social History Social History   Socioeconomic History   Marital status: Married    Spouse name: Not on file   Number of children: 1   Years of education: Not on file   Highest education level: Bachelor's degree (e.g., BA, AB, BS)  Occupational History   Not on file  Tobacco Use   Smoking status: Never   Smokeless tobacco: Never  Vaping Use   Vaping status: Never Used  Substance and  Sexual Activity   Alcohol use: Yes   Drug use: No   Sexual activity: Yes    Partners: Male    Birth control/protection: None  Other Topics Concern   Not on file  Social History Narrative   Not on file   Social Drivers of Health   Financial Resource Strain: Medium Risk (10/09/2023)   Overall Financial Resource Strain (CARDIA)    Difficulty of Paying Living Expenses: Somewhat hard  Food Insecurity: No Food Insecurity (10/09/2023)   Hunger Vital Sign    Worried About Running Out of Food in the Last Year: Never true    Ran Out of Food in the Last Year: Never true   Transportation Needs: No Transportation Needs (10/09/2023)   PRAPARE - Administrator, Civil Service (Medical): No    Lack of Transportation (Non-Medical): No  Physical Activity: Insufficiently Active (10/09/2023)   Exercise Vital Sign    Days of Exercise per Week: 2 days    Minutes of Exercise per Session: 20 min  Stress: No Stress Concern Present (10/09/2023)   [employer] of Occupational Health - Occupational Stress Questionnaire    Feeling of Stress : Not at all  Social Connections: Socially Integrated (10/09/2023)   Social Connection and Isolation Panel    Frequency of Communication with Friends and Family: Twice a week    Frequency of Social Gatherings with Friends and Family: Once a week    Attends Religious Services: More than 4 times per year    Active Member of Clubs or Organizations: Yes    Attends Banker Meetings: More than 4 times per year    Marital Status: Married  Intimate Partner Violence: Unknown (08/07/2021)   Received from [facility]   HITS    Physically Hurt: Not on file    Insult or Talk Down To: Not on file    Threaten Physical Harm: Not on file    Scream or Curse: Not on file    Lab Results  Component Value Date   CHOL 164 06/03/2023   Lab Results  Component Value Date   HDL 42 (L) 06/03/2023   Lab Results  Component Value Date   LDLCALC 101 (H) 06/03/2023   Lab Results  Component Value Date   TRIG 114 06/03/2023   Lab Results  Component Value Date   CHOLHDL 3.9 06/03/2023   Lab Results  Component Value Date   CREATININE 0.56 10/10/2023   Lab Results  Component Value Date   GFR 116.23 03/19/2023      Component Value Date/Time   NA 138 10/10/2023 0832   K 4.7 10/10/2023 0832   CL 106 10/10/2023 0832   CO2 26 10/10/2023 0832   GLUCOSE 82 10/10/2023 0832   BUN 10 10/10/2023 0832   CREATININE 0.56 10/10/2023 0832   CALCIUM 9.1 10/10/2023 0832   PROT 7.1 10/10/2023 0832   ALBUMIN 4.2 03/19/2023 0751   AST  12 10/10/2023 0832   ALT 18 10/10/2023 0832   ALKPHOS 97 03/19/2023 0751   BILITOT 0.4 10/10/2023 0832      Latest Ref Rng & Units 10/10/2023    8:32 AM 06/03/2023    8:54 AM 03/19/2023    7:51 AM  BMP  Glucose 65 - 99 mg/dL 82  76  87   BUN 7 - 25 mg/dL 10  18  13    Creatinine 0.50 - 0.97 mg/dL 9.43  9.41  9.34   BUN/Creat Ratio 6 - 22 (calc) SEE  NOTE:  SEE NOTE:    Sodium 135 - 146 mmol/L 138  138  140   Potassium 3.5 - 5.3 mmol/L 4.7  4.4  4.3   Chloride 98 - 110 mmol/L 106  105  104   CO2 20 - 32 mmol/L 26  25  29    Calcium 8.6 - 10.2 mg/dL 9.1  9.4  9.3        Component Value Date/Time   WBC 10.3 10/10/2023 0832   RBC 4.92 10/10/2023 0832   HGB 13.2 10/10/2023 0832   HCT 40.5 10/10/2023 0832   PLT 362.0 10/10/2023 0832   MCV 82.4 10/10/2023 0832   MCHC 32.6 10/10/2023 0832   RDW 15.1 10/10/2023 0832   LYMPHSABS 2.3 10/10/2023 0832   MONOABS 0.6 10/10/2023 0832   EOSABS 0.1 10/10/2023 0832   BASOSABS 0.1 10/10/2023 0832   Lab Results  Component Value Date   TSH 3.35 10/10/2023   TSH 2.87 03/19/2023   TSH 0.385 (L) 07/19/2021   FREET4 1.06 10/10/2023   FREET4 0.98 03/19/2023         Parts of this note may have been dictated using voice recognition software. There may be variances in spelling and vocabulary which are unintentional. Not all errors are proofread. Please notify the dino if any discrepancies are noted or if the meaning of any statement is not clear.

## 2024-04-05 ENCOUNTER — Other Ambulatory Visit: Payer: Self-pay | Admitting: Urgent Care

## 2024-04-05 ENCOUNTER — Encounter: Payer: Self-pay | Admitting: "Endocrinology

## 2024-04-05 DIAGNOSIS — L83 Acanthosis nigricans: Secondary | ICD-10-CM

## 2024-04-05 DIAGNOSIS — R635 Abnormal weight gain: Secondary | ICD-10-CM

## 2024-04-05 DIAGNOSIS — Z6841 Body Mass Index (BMI) 40.0 and over, adult: Secondary | ICD-10-CM

## 2024-04-05 DIAGNOSIS — E88819 Insulin resistance, unspecified: Secondary | ICD-10-CM

## 2024-04-06 ENCOUNTER — Other Ambulatory Visit (HOSPITAL_COMMUNITY): Payer: Self-pay

## 2024-04-06 ENCOUNTER — Telehealth: Payer: Self-pay

## 2024-04-06 NOTE — Telephone Encounter (Signed)
 Pharmacy Patient Advocate Encounter   Received notification from Pt Calls Messages that prior authorization for Phentermine -Topiramate  ER 7.5-46MG  er capsules is required/requested.   Insurance verification completed.   The patient is insured through [facility].   Per test claim: PA required; PA submitted to above mentioned insurance via Latent Key/confirmation #/[id number] [facility] Status is pending

## 2024-04-09 NOTE — Telephone Encounter (Signed)
 Pharmacy Patient Advocate Encounter  Received notification from [facility] that Prior Authorization for Phentermine -Topiramate  has been APPROVED from 04/06/2024 to 07/05/2024
